# Patient Record
Sex: Female | Born: 1940 | Race: White | Hispanic: No | Marital: Married | Smoking: Never smoker
Health system: Southern US, Community
[De-identification: ages and names within clinical notes are randomized; demographics above are authoritative.]

## PROBLEM LIST (undated history)

## (undated) DIAGNOSIS — E785 Hyperlipidemia, unspecified: Secondary | ICD-10-CM

## (undated) DIAGNOSIS — I10 Essential (primary) hypertension: Secondary | ICD-10-CM

## (undated) DIAGNOSIS — M810 Age-related osteoporosis without current pathological fracture: Secondary | ICD-10-CM

## (undated) HISTORY — DX: Age-related osteoporosis without current pathological fracture: M81.0

## (undated) HISTORY — DX: Hyperlipidemia, unspecified: E78.5

## (undated) HISTORY — DX: Essential (primary) hypertension: I10

---

## 1996-04-15 HISTORY — PX: LUMBAR DISC SURGERY: SHX700

## 1998-06-13 LAB — HM COLONOSCOPY: HM Colonoscopy: NORMAL

## 1999-05-08 ENCOUNTER — Other Ambulatory Visit: Admission: RE | Admit: 1999-05-08 | Discharge: 1999-05-08 | Payer: Self-pay | Admitting: *Deleted

## 2003-06-27 ENCOUNTER — Encounter: Admission: RE | Admit: 2003-06-27 | Discharge: 2003-06-27 | Payer: Self-pay | Admitting: Internal Medicine

## 2004-10-24 ENCOUNTER — Encounter: Admission: RE | Admit: 2004-10-24 | Discharge: 2004-10-24 | Payer: Self-pay | Admitting: Internal Medicine

## 2005-11-15 ENCOUNTER — Encounter: Admission: RE | Admit: 2005-11-15 | Discharge: 2005-11-15 | Payer: Self-pay | Admitting: Internal Medicine

## 2006-05-08 ENCOUNTER — Other Ambulatory Visit: Admission: RE | Admit: 2006-05-08 | Discharge: 2006-05-08 | Payer: Self-pay | Admitting: Internal Medicine

## 2006-12-05 ENCOUNTER — Ambulatory Visit: Payer: Self-pay | Admitting: Vascular Surgery

## 2006-12-18 ENCOUNTER — Encounter: Admission: RE | Admit: 2006-12-18 | Discharge: 2006-12-18 | Payer: Self-pay | Admitting: Internal Medicine

## 2007-12-11 ENCOUNTER — Ambulatory Visit: Payer: Self-pay | Admitting: Vascular Surgery

## 2007-12-25 ENCOUNTER — Encounter: Admission: RE | Admit: 2007-12-25 | Discharge: 2007-12-25 | Payer: Self-pay | Admitting: Internal Medicine

## 2008-03-09 ENCOUNTER — Encounter: Payer: Self-pay | Admitting: Family Medicine

## 2008-07-18 DIAGNOSIS — I10 Essential (primary) hypertension: Secondary | ICD-10-CM

## 2008-07-18 DIAGNOSIS — E785 Hyperlipidemia, unspecified: Secondary | ICD-10-CM

## 2008-07-18 HISTORY — DX: Hyperlipidemia, unspecified: E78.5

## 2008-07-18 HISTORY — DX: Essential (primary) hypertension: I10

## 2008-07-25 ENCOUNTER — Ambulatory Visit: Payer: Self-pay | Admitting: Family Medicine

## 2008-07-25 DIAGNOSIS — M810 Age-related osteoporosis without current pathological fracture: Secondary | ICD-10-CM

## 2008-07-25 HISTORY — DX: Age-related osteoporosis without current pathological fracture: M81.0

## 2008-08-19 ENCOUNTER — Ambulatory Visit: Payer: Self-pay | Admitting: Family Medicine

## 2008-11-17 ENCOUNTER — Ambulatory Visit: Payer: Self-pay | Admitting: Family Medicine

## 2008-11-17 LAB — CONVERTED CEMR LAB
ALT: 20 units/L (ref 0–35)
Direct LDL: 124.6 mg/dL
HDL: 96.2 mg/dL (ref >39.00)
Total Bilirubin: 1.1 mg/dL (ref 0.3–1.2)
Total CHOL/HDL Ratio: 2
Triglycerides: 53 mg/dL (ref 0.0–149.0)
VLDL: 10.6 mg/dL (ref 0.0–40.0)

## 2008-12-08 ENCOUNTER — Telehealth: Payer: Self-pay | Admitting: Family Medicine

## 2008-12-26 ENCOUNTER — Encounter: Admission: RE | Admit: 2008-12-26 | Discharge: 2008-12-26 | Payer: Self-pay | Admitting: Family Medicine

## 2009-01-23 ENCOUNTER — Ambulatory Visit: Payer: Self-pay | Admitting: Family Medicine

## 2009-05-19 ENCOUNTER — Ambulatory Visit: Payer: Self-pay | Admitting: Family Medicine

## 2009-05-22 LAB — CONVERTED CEMR LAB
Albumin: 4.4 g/dL (ref 3.5–5.2)
Alkaline Phosphatase: 59 units/L (ref 39–117)
Cholesterol: 226 mg/dL — ABNORMAL HIGH (ref 0–200)
Total Protein: 7 g/dL (ref 6.0–8.3)
Triglycerides: 62 mg/dL (ref 0.0–149.0)
VLDL: 12.4 mg/dL (ref 0.0–40.0)

## 2009-05-26 ENCOUNTER — Ambulatory Visit: Payer: Self-pay | Admitting: Family Medicine

## 2009-05-26 LAB — CONVERTED CEMR LAB: LDL Goal: 100 mg/dL

## 2009-11-23 ENCOUNTER — Ambulatory Visit: Payer: Self-pay | Admitting: Family Medicine

## 2010-01-19 ENCOUNTER — Encounter: Admission: RE | Admit: 2010-01-19 | Discharge: 2010-01-19 | Payer: Self-pay | Admitting: Family Medicine

## 2010-05-15 NOTE — Assessment & Plan Note (Signed)
Summary: [name] [name]   Vital Signs:  Patient profile:   70 year old female Menstrual status:  postmenopausal Height:      64 inches Weight:      120 pounds BMI:     20.67 Temp:     97.6 degrees F oral BP sitting:   140 / 84  (left arm) Cuff size:   regular  Vitals Entered By: Sid Falcon LPN (May 26, 2009 10:42 AM) CC: ROV follow-up, labs done, Hypertension Management, Lipid Management Is Patient Diabetic? No   History of Present Illness: Patient seen for followup regarding hypertension and hyperlipidemia. Blood pressure log reviewed and blood pressure is very well controlled on Benicar 20 mg. Taking Crestor consistently. Labs reviewed with patient. Excellent HDL of 114. LDL close to goal. Previous carotid artery disease. No recent TIA symptoms. No chest pain. Needs tetanus booster.  History of osteoporosis. Patient took herself off Fosamax recently. Exercising and taking adequate calcium and vitamin D. No recent falls.  Hypertension History:      She denies headache, chest pain, palpitations, dyspnea with exertion, orthopnea, PND, peripheral edema, visual symptoms, neurologic problems, syncope, and side effects from treatment.  She notes no problems with any antihypertensive medication side effects.        Positive major cardiovascular risk factors include female age 44 years old or older, hyperlipidemia, and hypertension.  Negative major cardiovascular risk factors include no history of diabetes, negative family history for ischemic heart disease, and non-tobacco-user status.        Positive history for target organ damage include peripheral vascular disease.  Further assessment for target organ damage reveals no history of ASHD or stroke/TIA.    Lipid Management History:      Positive NCEP/ATP III risk factors include female age 64 years old or older, hypertension, and peripheral vascular disease.  Negative NCEP/ATP III risk factors include non-diabetic, HDL cholesterol greater  than 60, no family history for ischemic heart disease, non-tobacco-user status, no ASHD (atherosclerotic heart disease), no prior stroke/TIA, and no history of aortic aneurysm.      Allergies: No Known Drug Allergies  Past History:  Past Medical History: Last updated: 01/23/2009 frequent headaches, early Hyperlipidemia Hypertension Osteoporosis  Social History: Last updated: 07/25/2008 Retired Married Alcohol use-yes socially Never Smoked PMH reviewed for relevance, PSH reviewed for relevance  Review of Systems  The patient denies anorexia, weight loss, weight gain, chest pain, syncope, dyspnea on exertion, peripheral edema, prolonged cough, headaches, hemoptysis, abdominal pain, melena, and hematochezia.    Physical Exam  General:  Well-developed,well-nourished,in no acute distress; alert,appropriate and cooperative throughout examination Ears:  External ear exam shows no significant lesions or deformities.  Otoscopic examination reveals clear canals, tympanic membranes are intact bilaterally without bulging, retraction, inflammation or discharge. Hearing is grossly normal bilaterally. Mouth:  Oral mucosa and oropharynx without lesions or exudates.  Teeth in good repair. Neck:  No deformities, masses, or tenderness noted. Lungs:  Normal respiratory effort, chest expands symmetrically. Lungs are clear to auscultation, no crackles or wheezes. Heart:  normal rate and regular rhythm.   Pulses:  R radial normal and L radial normal.   Extremities:  no edema Neurologic:  alert & oriented X3, cranial nerves II-XII intact, and strength normal in all extremities.   Psych:  normally interactive, good eye contact, not anxious appearing, and not depressed appearing.     Impression & Recommendations:  Problem # 1:  HYPERTENSION (ICD-401.9) stable. Her updated medication list for this problem includes:  Benicar 40 Mg Tabs (Olmesartan medoxomil) .Marland Kitchen... 1/2 tab daily  Problem # 2:   HYPERLIPIDEMIA (ICD-272.4) LDL 107 but HDL 114.  Cont same dose Crestor. Her updated medication list for this problem includes:    Crestor 20 Mg Tabs (Rosuvastatin calcium) ..... Once daily  Problem # 3:  Preventive Health Care (ICD-V70.0) tetanus booster be given. Patient has not had DEXA scan in 2 years at this point does not wish to pursue further scans.  Problem # 4:  OSTEOPOROSIS (ICD-733.00)  Her updated medication list for this problem includes:    Calcium 600 1500 Mg Tabs (Calcium carbonate) ..... One tab two times a day    Vitamin D 1000 Unit Tabs (Cholecalciferol) ..... Once daily  Complete Medication List: 1)  Benicar 40 Mg Tabs (Olmesartan medoxomil) .... 1/2 tab daily 2)  Crestor 20 Mg Tabs (Rosuvastatin calcium) .... Once daily 3)  Multi For Her 50+ Tabs (Multiple vitamins-minerals) .... Once daily 4)  Aspirin Adult Low Strength 81 Mg Tbec (Aspirin) .... Once daily 5)  Calcium 600 1500 Mg Tabs (Calcium carbonate) .... One tab two times a day 6)  Vitamin D 1000 Unit Tabs (Cholecalciferol) .... Once daily  Other Orders: TD Toxoids IM 7 YR + (16109) Admin 1st Vaccine (60454)  Hypertension Assessment/Plan:      The patient's hypertensive risk group is category C: Target organ damage and/or diabetes.  Today's blood pressure is 140/84.    Lipid Assessment/Plan:      Based on NCEP/ATP III, the patient's risk factor category is "history of coronary disease, peripheral vascular disease, cerebrovascular disease, or aortic aneurysm".  The patient's lipid goals are as follows: Total cholesterol goal is 200; LDL cholesterol goal is 100; HDL cholesterol goal is 40; Triglyceride goal is 150.    Patient Instructions: 1)  Please schedule a follow-up appointment in 6 months .  2)  It is important that you exercise reguarly at least 20 minutes 5 times a week. If you develop chest pain, have severe difficulty breathing, or feel very tired, stop exercising immediately and seek medical  attention.  3)  Take calcium +vitamin D daily.   Preventive Care Screening  Mammogram:    Date:  12/14/2008    Results:  normal   Pap Smear:    Date:  04/16/2003    Results:  normal    Preventive Care Screening  Mammogram:    Date:  12/14/2008    Results:  normal   Pap Smear:    Date:  04/16/2003    Results:  normal     Immunizations Administered:  Tetanus Vaccine:    Vaccine Type: Td    Site: left deltoid    Mfr: Sanofi Pasteur    Dose: 0.5 ml    Route: IM    Given by: Sid Falcon LPN    Exp. Date: 02/28/2011    Lot #: [id number]

## 2010-05-15 NOTE — Assessment & Plan Note (Signed)
Summary: 6 month follow up/pt coming in fasting/cjr   Vital Signs:  Patient profile:   70 year old female Menstrual status:  postmenopausal Weight:      117 pounds Temp:     98.2 degrees F oral Pulse rate:   66 / minute BP sitting:   160 / 90  (left arm) Cuff size:   regular  Vitals Entered By: Romualdo Bolk, CMA (AAMA) (November 23, 2009 8:26 AM)  Serial Vital Signs/Assessments:  Time      Position  BP       Pulse  Resp  Temp     By                     142/82                         Evelena Peat MD  CC: Follow-up visit- Pt is fasting for labs, Hypertension Management   History of Present Illness: Patient here for followup hypertension and hyperlipidemia. Blood pressure is well controlled by home readings. Remains on Benicar 40 mg one half tablet daily.  Lipids adequately controlled last visit. No side effects from medication. No history of CAD or cerebrovascular disease. Mild peripheral vascular disease carotid arteries from prior [facility] screening. No hx of TIA .  History osteoporosis. No history of fall or fracture. Patient taking regular calcium and vitamin D and quite active. Reluctant to consider further osteoporosis medications.  Hypertension History:      She denies headache, chest pain, palpitations, dyspnea with exertion, orthopnea, PND, peripheral edema, visual symptoms, neurologic problems, syncope, and side effects from treatment.  She notes no problems with any antihypertensive medication side effects.        Positive major cardiovascular risk factors include female age 70 years old or older, hyperlipidemia, and hypertension.  Negative major cardiovascular risk factors include no history of diabetes, negative family history for ischemic heart disease, and non-tobacco-user status.        Positive history for target organ damage include peripheral vascular disease.  Further assessment for target organ damage reveals no history of ASHD or stroke/TIA.      Preventive Screening-Counseling & Management  Alcohol-Tobacco     Alcohol drinks/day: 1     Alcohol type: wine     Smoking Status: never  Caffeine-Diet-Exercise     Caffeine use/day: 1     Does Patient Exercise: yes  Current Medications (verified): 1)  Benicar 40 Mg Tabs (Olmesartan Medoxomil) .... 1/2 Tab Daily 2)  Crestor 20 Mg Tabs (Rosuvastatin Calcium) .... Once Daily 3)  Multi For Her 50+  Tabs (Multiple Vitamins-Minerals) .... Once Daily 4)  Aspirin Adult Low Strength 81 Mg Tbec (Aspirin) .... Once Daily 5)  Calcium 600 1500 Mg Tabs (Calcium Carbonate) .... One Tab Two Times A Day 6)  Vitamin D 1000 Unit Tabs (Cholecalciferol) .... Once Daily  Allergies (verified): No Known Drug Allergies  Past History:  Past Medical History: Last updated: 01/23/2009 frequent headaches, early Hyperlipidemia Hypertension Osteoporosis  Past Surgical History: Last updated: 07/18/2008 Discectomy 1998  Family History: Last updated: 07/25/2008 Family History High cholesterol Family History Hypertension Ovarian CA, mother Breast CA, aunt Cerebral aneurysm father  Social History: Last updated: 07/25/2008 Retired Married Alcohol use-yes socially Never Smoked  Risk Factors: Alcohol Use: 1 (11/23/2009) Caffeine Use: 1 (11/23/2009) Exercise: yes (11/23/2009)  Risk Factors: Smoking Status: never (11/23/2009) PMH-FH-SH reviewed for relevance  Social History: Caffeine use/day:  1  Does Patient Exercise:  yes  Review of Systems  The patient denies anorexia, fever, weight loss, chest pain, syncope, dyspnea on exertion, peripheral edema, prolonged cough, headaches, hemoptysis, abdominal pain, melena, hematochezia, severe indigestion/heartburn, muscle weakness, and difficulty walking.    Physical Exam  General:  Well-developed,well-nourished,in no acute distress; alert,appropriate and cooperative throughout examination Mouth:  Oral mucosa and oropharynx without lesions  or exudates.  Teeth in good repair. Neck:  No deformities, masses, or tenderness noted. Lungs:  Normal respiratory effort, chest expands symmetrically. Lungs are clear to auscultation, no crackles or wheezes. Heart:  normal rate, regular rhythm, and no murmur.   Extremities:  No clubbing, cyanosis, edema, or deformity noted with normal full range of motion of all joints.   Skin:  no rashes and no suspicious lesions.   Psych:  normally interactive, good eye contact, not anxious appearing, and not depressed appearing.     Impression & Recommendations:  Problem # 1:  HYPERTENSION (ICD-401.9) suspect whitecoat syndrome Her updated medication list for this problem includes:    Benicar 40 Mg Tabs (Olmesartan medoxomil) .Marland Kitchen... 1/2 tab daily  Problem # 2:  HYPERLIPIDEMIA (ICD-272.4)  Her updated medication list for this problem includes:    Crestor 20 Mg Tabs (Rosuvastatin calcium) ..... Once daily  Problem # 3:  OSTEOPOROSIS (ICD-733.00) have again discussed possible treatment options and she dose not wish to consider further meds.  She is aware foundation of treatment/prevention is adequate calcium/Vit D and exercise. Her updated medication list for this problem includes:    Calcium 600 1500 Mg Tabs (Calcium carbonate) ..... One tab two times a day    Vitamin D 1000 Unit Tabs (Cholecalciferol) ..... Once daily  Complete Medication List: 1)  Benicar 40 Mg Tabs (Olmesartan medoxomil) .... 1/2 tab daily 2)  Crestor 20 Mg Tabs (Rosuvastatin calcium) .... Once daily 3)  Multi For Her 50+ Tabs (Multiple vitamins-minerals) .... Once daily 4)  Aspirin Adult Low Strength 81 Mg Tbec (Aspirin) .... Once daily 5)  Calcium 600 1500 Mg Tabs (Calcium carbonate) .... One tab two times a day 6)  Vitamin D 1000 Unit Tabs (Cholecalciferol) .... Once daily  Hypertension Assessment/Plan:      The patient's hypertensive risk group is category C: Target organ damage and/or diabetes.  Today's blood pressure is  160/90.    Patient Instructions: 1)  Please schedule a follow-up appointment in 6 months .  2)  It is important that you exercise reguarly at least 20 minutes 5 times a week. If you develop chest pain, have severe difficulty breathing, or feel very tired, stop exercising immediately and seek medical attention.  3)  Take calcium +vitamin D daily.

## 2010-06-13 ENCOUNTER — Encounter: Payer: Self-pay | Admitting: Family Medicine

## 2010-06-13 ENCOUNTER — Ambulatory Visit (INDEPENDENT_AMBULATORY_CARE_PROVIDER_SITE_OTHER): Payer: PRIVATE HEALTH INSURANCE | Admitting: Family Medicine

## 2010-06-13 DIAGNOSIS — E785 Hyperlipidemia, unspecified: Secondary | ICD-10-CM

## 2010-06-13 DIAGNOSIS — M810 Age-related osteoporosis without current pathological fracture: Secondary | ICD-10-CM

## 2010-06-13 DIAGNOSIS — I10 Essential (primary) hypertension: Secondary | ICD-10-CM

## 2010-06-13 LAB — HEPATIC FUNCTION PANEL
Albumin: 4.4 g/dL (ref 3.5–5.2)
Bilirubin, Direct: 0.1 mg/dL (ref 0.0–0.3)
Total Protein: 6.7 g/dL (ref 6.0–8.3)

## 2010-06-13 LAB — LIPID PANEL
Cholesterol: 233 mg/dL — ABNORMAL HIGH (ref 0–200)
HDL: 115.2 mg/dL (ref >39.00)
VLDL: 12 mg/dL (ref 0.0–40.0)

## 2010-06-13 LAB — BASIC METABOLIC PANEL
Chloride: 102 mEq/L (ref 96–112)
Potassium: 4.6 mEq/L (ref 3.5–5.1)

## 2010-06-13 LAB — LDL CHOLESTEROL, DIRECT: Direct LDL: 109.2 mg/dL

## 2010-06-13 NOTE — Progress Notes (Signed)
  Subjective:    Patient ID: [name] [name], female    DOB: 31-Aug-1940, 70 y.o.   MRN: [id number]  HPI  the patient is seen for routine medical followup. Chronic positive history of osteoporosis, hyperlipidemia, and hypertension. She remains on Benicar 20 mg daily. While the blood pressure readings at home very well controlled. No orthostasis. The chest pains or dizziness.   Hyperlipidemia treated with Crestor. No myalgias or arthralgias. No history of CAD or peripheral vascular disease.  History of osteoporosis. treated for several years with Fosamax. She is reluctant to go for the medications at this time.  She remains on calcium and vitamin D and walking for weight-bearing exercise.   Review of Systems  Constitutional: Negative for fever, activity change, appetite change and fatigue.  HENT: Negative for hearing loss, ear pain, sore throat and trouble swallowing.   Eyes: Negative for visual disturbance.  Respiratory: Negative for cough and shortness of breath.   Cardiovascular: Negative for chest pain and palpitations.  Gastrointestinal: Negative for abdominal pain, diarrhea, constipation and blood in stool.  Genitourinary: Negative for dysuria and hematuria.  Musculoskeletal: Negative for myalgias, back pain and arthralgias.  Skin: Negative for rash.  Neurological: Negative for dizziness, syncope and headaches.  Hematological: Negative for adenopathy.  Psychiatric/Behavioral: Negative for confusion and dysphoric mood.       Objective:   Physical Exam  patient is alert and in no distress Oropharynx is clear Pupils equal reactive to light Eardrums are normal Neck supple no mass Chest clear to auscultation Heart regular rhythm and rate Extremities no edema       Assessment & Plan:   #1 hypertension well controlled. Provided further samples of Benicar. Routine followup 6 months #2   Hyperlipidemia. Check lipid panel and hepatic panel.  #3 osteoporosis. Discussed treatment  options. Reluctant to look at further prescription medications. Continue weightbearing exercise, calcium, and vitamin D.  #4 health maintenance. Discussed colonoscopy screening. She refuses at this time. Discuss Hemoccults and she does not wish to pursue. Will continue with yearly mammogram.

## 2010-06-14 NOTE — Progress Notes (Signed)
Quick Note:  Pt husband informed, home number, only number ______

## 2010-08-28 NOTE — Consult Note (Signed)
NEW PATIENT CONSULTATION   [name], [name]  DOB:  1940-09-18                                       12/05/2006  CHART#:[id number]   HISTORY OF PRESENT ILLNESS:  The patient presents today for evaluation  of asymptomatic carotid disease.  She is an otherwise, healthy, very  pleasant, 70 year old white female who was noted to have carotid disease  on an outpatient screen and I am seeing her for further discussion of  this.  She has no prior focal neurologic deficits, specifically no  amaurosis fugax, CVA, TIA, MI or stroke.  She does have slightly  elevated lipids but otherwise no major medical difficulties.  She does  have mild hypertension and osteoporosis.   MEDICATIONS:  Hydrochlorothiazide, multivitamins.  She does an 81 mg  aspirin per day.  She is on Fosamax and Crestor and Benicar.   ALLERGIES:  No known allergies.   SOCIAL HISTORY:  She does not smoke cigarettes and has rare alcohol  abuse.   FAMILY HISTORY:  Significant for father dying at age 70 with cerebral  aneurysm.  She has no history of premature atherosclerotic disease in  her family.   PHYSICAL EXAMINATION:  General:  Well developed and well nourished white  female appearing her stated age of 23.  Vital signs:  Blood pressure  145/76 left arm, 145/78 right arm, pulse 81, respirations 16.  Her  carotids upstrokes are without bruit bilaterally.  She has 2+ radial  pulses and 2+ posterior tibial pulses bilaterally.   I reviewed her carotid duplex from [facility] on  09/15/06.  This revealed 40-59% right internal carotid artery stenosis  which I would say is at the lower end of this scale and mild stenosis in  the left carotid system.  I discussed this at length with the patient.  I explained that this does not put her at any significant increased risk  for stroke with mild to moderate disease.  I reviewed symptoms of  carotid disease with her; she will notify me should this  occur.  Otherwise, we will see her again in one year with repeat carotid duplex  at that time for follow up.   Larina Earthly, M.D.  Electronically Signed   TFE/MEDQ  D:  12/05/2006  T:  12/08/2006  Job:  [id number]   cc:   Soyla Murphy. Renne Crigler, M.D.

## 2010-08-28 NOTE — Procedures (Signed)
CAROTID DUPLEX EXAM   INDICATION:  Followup evaluation of known carotid artery disease.   HISTORY:  Diabetes:  No.  Cardiac:  No.  Hypertension:  Yes.  Smoking:  No.  Previous Surgery:  No.  CV History:  Previous duplex performed 09/15/2006 revealed 40-59% right  ICA stenosis and less than 40% left ICA stenosis.  Amaurosis Fugax No, Paresthesias No, Hemiparesis No                                       RIGHT             LEFT  Brachial systolic pressure:         126               126  Brachial Doppler waveforms:         Triphasic         Triphasic  Vertebral direction of flow:        Antegrade         Antegrade  DUPLEX VELOCITIES (cm/sec)  CCA peak systolic                   91                82  ECA peak systolic                   127               128  ICA peak systolic                   133               137  ICA end diastolic                   38                44  PLAQUE MORPHOLOGY:                  Calcified         Calcified  PLAQUE AMOUNT:                      Mild to moderate  Mild to moderate  PLAQUE LOCATION:                    Proximal ICA      Proximal ICA       IMPRESSION:  1. 40-59% ICA stenosis bilaterally.  2. No significant change from previous study performed 09/15/2006.   ___________________________________________  Larina Earthly, M.D.   MC/MEDQ  D:  12/11/2007  T:  12/11/2007  Job:  [id number]

## 2010-08-28 NOTE — Assessment & Plan Note (Signed)
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  She is a healthy 70 year old white  female with known asymptomatic carotid disease.  She denies any  neurologic deficits, specifically amaurosis fugax, transient ischemic  attack, or stroke.  Her past medical history continues to be for  controlled hypertension and elevated cholesterol.   SOCIAL HISTORY:  She is married.  Does not smoke.  Has 1 alcohol drink  per day.   REVIEW OF SYSTEMS:  Reveals weight 120 pounds, height 5 feet 5 inches  tall.   MEDICATIONS:  Fosamax, Benicar, Crestor, multivitamin, and calcium  supplement.   PHYSICAL EXAM:  Well-developed, well-nourished white female appearing  stated age of 50.  Blood pressure is 118/70, pulse 74, respirations 18.  Her carotid arteries without bruits bilaterally.  She has 2+ radial  pulses bilaterally.  She is grossly intact neurologically.   She underwent repeat carotid duplex today in our office and I reviewed  this with her.  This reveals no change in her carotid study.  She does  have a moderate 40-59% internal carotid artery stenosis bilaterally.  I  discussed this at length with the patient.  Recommended continued yearly  observation and ultrasound.  She will notify us should she develop any  new deficits.   Larina Earthly, M.D.  Electronically Signed   TFE/MEDQ  D:  12/11/2007  T:  12/14/2007  Job:  [id number]   cc:   Soyla Murphy. Renne Crigler, M.D.

## 2010-10-23 ENCOUNTER — Other Ambulatory Visit: Payer: Self-pay | Admitting: Family Medicine

## 2010-12-12 ENCOUNTER — Ambulatory Visit (INDEPENDENT_AMBULATORY_CARE_PROVIDER_SITE_OTHER): Payer: PRIVATE HEALTH INSURANCE | Admitting: Family Medicine

## 2010-12-12 ENCOUNTER — Encounter: Payer: Self-pay | Admitting: Family Medicine

## 2010-12-12 DIAGNOSIS — E785 Hyperlipidemia, unspecified: Secondary | ICD-10-CM

## 2010-12-12 DIAGNOSIS — M810 Age-related osteoporosis without current pathological fracture: Secondary | ICD-10-CM

## 2010-12-12 DIAGNOSIS — I10 Essential (primary) hypertension: Secondary | ICD-10-CM

## 2010-12-12 NOTE — Progress Notes (Signed)
  Subjective:    Patient ID: [name] [name], female    DOB: [birth year], 70 y.o.   MRN: [id number]  HPI Medical followup. History of hyperlipidemia, hypertension, and osteoporosis. She takes Crestor 5 days per week. History of excellent HDL. Lipids were checked in February and stable. Takes Benicar 40 mg one half tablet daily. Blood pressure well controlled by home readings which are reviewed. No orthostasis. Denies any chest pains or dyspnea. Walking for exercise most days of week.  Past Medical History  Diagnosis Date  . HYPERLIPIDEMIA 07/18/2008  . HYPERTENSION 07/18/2008  . OSTEOPOROSIS 07/25/2008   Past Surgical History  Procedure Date  . Lumbar disc surgery 1998    reports that she has never smoked. She does not have any smokeless tobacco history on file. Her alcohol and drug histories not on file. family history includes Aneurysm in her father; Cancer in her paternal aunt and paternal grandmother; Hyperlipidemia in her mother and sister; Hypertension in her mother and sister; and Stroke (age of onset:47) in her father. No Known Allergies    Review of Systems  Constitutional: Negative for fatigue.  Eyes: Negative for visual disturbance.  Respiratory: Negative for cough, chest tightness, shortness of breath and wheezing.   Cardiovascular: Negative for chest pain, palpitations and leg swelling.  Neurological: Negative for dizziness, seizures, syncope, weakness, light-headedness and headaches.       Objective:   Physical Exam  Constitutional: She is oriented to person, place, and time. She appears well-developed and well-nourished.  HENT:  Head: Normocephalic and atraumatic.  Eyes: EOM are normal. Pupils are equal, round, and reactive to light.  Neck: Normal range of motion. Neck supple. No thyromegaly present.  Cardiovascular: Normal rate, regular rhythm and normal heart sounds.   No murmur heard. Pulmonary/Chest: Breath sounds normal. No respiratory distress. She has no wheezes. She  has no rales.  Abdominal: Soft. Bowel sounds are normal.  Musculoskeletal: Normal range of motion. She exhibits no edema.  Lymphadenopathy:    She has no cervical adenopathy.  Neurological: She is alert and oriented to person, place, and time. No cranial nerve deficit.  Skin: No rash noted.  Psychiatric: She has a normal mood and affect. Her behavior is normal. Judgment and thought content normal.          Assessment & Plan:  #1 hypertension stable. Continue current medications. Samples of Benicar provided #2 hyperlipidemia. Recheck lipids in 6 months. No intolerance to Crestor #3 osteoporosis. Reviewed exercise, vitamin D, and calcium.

## 2011-01-17 ENCOUNTER — Other Ambulatory Visit: Payer: Self-pay | Admitting: Family Medicine

## 2011-01-23 ENCOUNTER — Other Ambulatory Visit: Payer: Self-pay | Admitting: Family Medicine

## 2011-01-23 DIAGNOSIS — Z1231 Encounter for screening mammogram for malignant neoplasm of breast: Secondary | ICD-10-CM

## 2011-01-29 ENCOUNTER — Ambulatory Visit
Admission: RE | Admit: 2011-01-29 | Discharge: 2011-01-29 | Disposition: A | Discharge status: Home / Self Care | Payer: Medicare Other | Source: Ambulatory Visit | Attending: Family Medicine | Admitting: Family Medicine

## 2011-01-29 DIAGNOSIS — Z1231 Encounter for screening mammogram for malignant neoplasm of breast: Secondary | ICD-10-CM

## 2011-05-23 ENCOUNTER — Telehealth: Payer: Self-pay | Admitting: Family Medicine

## 2011-05-23 NOTE — Telephone Encounter (Signed)
Pt has ov sch on 06/13/11, but is needing samples of olmesartan (BENICAR) 40 MG tablet to last until her ov. Pt says that she is 2 and 1/2 wks short on having enough. Pls call when ready for pick up.

## 2011-05-23 NOTE — Telephone Encounter (Signed)
Pt informed samples ready to pick-up 

## 2011-06-13 ENCOUNTER — Ambulatory Visit (INDEPENDENT_AMBULATORY_CARE_PROVIDER_SITE_OTHER): Payer: Medicare Other | Admitting: Family Medicine

## 2011-06-13 ENCOUNTER — Encounter: Payer: Self-pay | Admitting: Family Medicine

## 2011-06-13 DIAGNOSIS — I10 Essential (primary) hypertension: Secondary | ICD-10-CM

## 2011-06-13 DIAGNOSIS — M810 Age-related osteoporosis without current pathological fracture: Secondary | ICD-10-CM

## 2011-06-13 DIAGNOSIS — E785 Hyperlipidemia, unspecified: Secondary | ICD-10-CM

## 2011-06-13 LAB — LIPID PANEL
Cholesterol: 224 mg/dL — ABNORMAL HIGH (ref 0–200)
HDL: 116.1 mg/dL (ref >39.00)
Triglycerides: 53 mg/dL (ref 0.0–149.0)
VLDL: 10.6 mg/dL (ref 0.0–40.0)

## 2011-06-13 LAB — HEPATIC FUNCTION PANEL
ALT: 20 U/L (ref 0–35)
Total Bilirubin: 0.7 mg/dL (ref 0.3–1.2)

## 2011-06-13 LAB — BASIC METABOLIC PANEL
BUN: 14 mg/dL (ref 6–23)
Creatinine, Ser: 1 mg/dL (ref 0.4–1.2)
GFR: 57.81 mL/min — ABNORMAL LOW (ref >60.00)

## 2011-06-13 LAB — LDL CHOLESTEROL, DIRECT: Direct LDL: 108.7 mg/dL

## 2011-06-13 NOTE — Patient Instructions (Signed)
Continue to monitor blood pressure closely. 

## 2011-06-13 NOTE — Progress Notes (Signed)
  Subjective:    Patient ID: [name] [name], female    DOB: 29-Dec-1940, 71 y.o.   MRN: [id number]  HPI  patient seen for medical followup. Has history of hypertension, hyperlipidemia, and osteoporosis. She is opposed to taking any prescription osteoporosis medications. She does take calcium and vitamin D. She exercises several days per week.  Blood pressures are well controlled by home readings. Average readings over the past several weeks 118 systolic and 62 diastolic. Takes Benicar 40 mg daily.  Compliant with therapy. No side effects. No headaches. Hyperlipidemia treated Crestor 20 mg 5 days per week. No myalgias. Compliant with therapy. History of excellent HDL but also high LDL. No history of CAD or peripheral vascular disease. Nonsmoker.  Past Medical History  Diagnosis Date  . HYPERLIPIDEMIA 07/18/2008  . HYPERTENSION 07/18/2008  . OSTEOPOROSIS 07/25/2008   Past Surgical History  Procedure Date  . Lumbar disc surgery 1998    reports that she has never smoked. She does not have any smokeless tobacco history on file. Her alcohol and drug histories not on file. family history includes Aneurysm in her father; Cancer in her paternal aunt and paternal grandmother; Hyperlipidemia in her mother and sister; Hypertension in her mother and sister; and Stroke (age of onset:47) in her father. No Known Allergies    Review of Systems  Constitutional: Negative for fatigue.  Eyes: Negative for visual disturbance.  Respiratory: Negative for cough, chest tightness, shortness of breath and wheezing.   Cardiovascular: Negative for chest pain, palpitations and leg swelling.  Neurological: Negative for dizziness, seizures, syncope, weakness, light-headedness and headaches.  Psychiatric/Behavioral: Negative for dysphoric mood.       Objective:   Physical Exam  Constitutional: She is oriented to person, place, and time. She appears well-developed and well-nourished.  HENT:  Head: Normocephalic and  atraumatic.  Eyes: EOM are normal. Pupils are equal, round, and reactive to light.  Neck: Normal range of motion. Neck supple. No thyromegaly present.  Cardiovascular: Normal rate, regular rhythm and normal heart sounds.   No murmur heard. Pulmonary/Chest: Breath sounds normal. No respiratory distress. She has no wheezes. She has no rales.  Abdominal: Soft. Bowel sounds are normal. She exhibits no distension and no mass. There is no tenderness. There is no rebound and no guarding.  Musculoskeletal: Normal range of motion. She exhibits no edema.  Lymphadenopathy:    She has no cervical adenopathy.  Neurological: She is alert and oriented to person, place, and time. She displays normal reflexes. No cranial nerve deficit.  Skin: No rash noted.  Psychiatric: She has a normal mood and affect. Her behavior is normal. Judgment and thought content normal.          Assessment & Plan:  #1 hypertension. Elevated reading here today but well controlled by home readings. Continue close monitoring. Be in touch if consistently over 140/90  #2 hyperlipidemia check lipid and hepatic panel  #3 history osteoporosis. Discussed medications again today. She is opposed to prescription medications. Continue calcium and vitamin D. Check vitamin D level.

## 2011-06-14 LAB — VITAMIN D 25 HYDROXY (VIT D DEFICIENCY, FRACTURES): Vit D, 25-Hydroxy: 50 ng/mL (ref 30–89)

## 2011-06-14 NOTE — Progress Notes (Signed)
Quick Note:  Pt informed, copy mailed to home as requested ______ 

## 2011-06-17 NOTE — Progress Notes (Signed)
Quick Note:  Pt informed, copy mailed as requested ______

## 2011-10-02 ENCOUNTER — Telehealth: Payer: Self-pay | Admitting: Family Medicine

## 2011-10-02 NOTE — Telephone Encounter (Signed)
Pt informed samples ready to pick-up

## 2011-10-02 NOTE — Telephone Encounter (Signed)
Pt requesting more samples of Benicar. Pt states that she only has 2 weeks of samples left. Please contact

## 2011-12-11 ENCOUNTER — Encounter: Payer: Self-pay | Admitting: Family Medicine

## 2011-12-11 ENCOUNTER — Ambulatory Visit (INDEPENDENT_AMBULATORY_CARE_PROVIDER_SITE_OTHER): Payer: Medicare Other | Admitting: Family Medicine

## 2011-12-11 VITALS — BP 162/78 | Temp 98.1°F | Wt 114.0 lb

## 2011-12-11 DIAGNOSIS — I10 Essential (primary) hypertension: Secondary | ICD-10-CM

## 2011-12-11 NOTE — Patient Instructions (Addendum)
Consider home blood pressure monitor and be in touch if BP consistently > 140/90

## 2011-12-11 NOTE — Progress Notes (Signed)
  Subjective:    Patient ID: [name] [name], female    DOB: 1940-12-19, 71 y.o.   MRN: [id number]  HPI  Medical follow up. History of hypertension which has been well controlled by home readings. Review of home readings reveals blood pressures mostly 130s systolic. No headaches or dizziness. Walks for exercise. Hyperlipidemia treated with Crestor. She has history of osteoporosis and has refused prescription medications. She remains on calcium and vitamin D along with weight bearing exercise and frequent yoga. Overall feels well. No mood issues. Very stable and very low risk for falls. Receiving yearly mammograms   Review of Systems  Constitutional: Negative for fatigue and unexpected weight change.  Eyes: Negative for visual disturbance.  Respiratory: Negative for cough, chest tightness, shortness of breath and wheezing.   Cardiovascular: Negative for chest pain, palpitations and leg swelling.  Neurological: Negative for dizziness, seizures, syncope, weakness, light-headedness and headaches.       Objective:   Physical Exam  Constitutional: She appears well-developed and well-nourished.  Neck: Neck supple.  Cardiovascular: Normal rate and regular rhythm.   Pulmonary/Chest: Effort normal and breath sounds normal. No respiratory distress. She has no wheezes. She has no rales.  Musculoskeletal: She exhibits no edema.          Assessment & Plan:  #1 hypertension. Probable whitecoat syndrome. Stable by home readings. Patient encouraged to continue monitoring.  Be in touch if blood pressure consistently greater than 140/90 #2 hyperlipidemia. Continue Crestor. Check lipids at followup.. Overall low risk for CAD and peripheral vascular disease. She has history of excellent HDL

## 2012-01-29 ENCOUNTER — Other Ambulatory Visit: Payer: Self-pay | Admitting: Family Medicine

## 2012-02-20 ENCOUNTER — Other Ambulatory Visit: Payer: Self-pay | Admitting: Family Medicine

## 2012-02-20 DIAGNOSIS — Z1231 Encounter for screening mammogram for malignant neoplasm of breast: Secondary | ICD-10-CM

## 2012-04-03 ENCOUNTER — Ambulatory Visit
Admission: RE | Admit: 2012-04-03 | Discharge: 2012-04-03 | Disposition: A | Discharge status: Home / Self Care | Payer: Medicare Other | Source: Ambulatory Visit | Attending: Family Medicine | Admitting: Family Medicine

## 2012-04-03 DIAGNOSIS — Z1231 Encounter for screening mammogram for malignant neoplasm of breast: Secondary | ICD-10-CM

## 2012-05-11 ENCOUNTER — Other Ambulatory Visit: Payer: Self-pay | Admitting: Family Medicine

## 2012-05-11 NOTE — Telephone Encounter (Signed)
Pt informed 4 samples available

## 2012-05-11 NOTE — Telephone Encounter (Signed)
Pt would like more samples of benicar 40 mg. Pt has appt end of feb.

## 2012-06-12 ENCOUNTER — Ambulatory Visit (INDEPENDENT_AMBULATORY_CARE_PROVIDER_SITE_OTHER): Payer: Medicare Other | Admitting: Family Medicine

## 2012-06-12 ENCOUNTER — Encounter: Payer: Self-pay | Admitting: Family Medicine

## 2012-06-12 VITALS — BP 152/88 | Temp 97.7°F | Wt 119.0 lb

## 2012-06-12 DIAGNOSIS — M810 Age-related osteoporosis without current pathological fracture: Secondary | ICD-10-CM

## 2012-06-12 DIAGNOSIS — E785 Hyperlipidemia, unspecified: Secondary | ICD-10-CM

## 2012-06-12 DIAGNOSIS — I10 Essential (primary) hypertension: Secondary | ICD-10-CM

## 2012-06-12 LAB — LIPID PANEL
HDL: 116.7 mg/dL (ref >39.00)
Triglycerides: 55 mg/dL (ref 0.0–149.0)

## 2012-06-12 LAB — HEPATIC FUNCTION PANEL
ALT: 21 U/L (ref 0–35)
AST: 28 U/L (ref 0–37)
Bilirubin, Direct: 0.1 mg/dL (ref 0.0–0.3)
Total Protein: 7.1 g/dL (ref 6.0–8.3)

## 2012-06-12 LAB — BASIC METABOLIC PANEL
BUN: 18 mg/dL (ref 6–23)
Calcium: 10.2 mg/dL (ref 8.4–10.5)
GFR: 56.99 mL/min — ABNORMAL LOW (ref >60.00)
Potassium: 5 mEq/L (ref 3.5–5.1)

## 2012-06-12 LAB — LDL CHOLESTEROL, DIRECT: Direct LDL: 104.5 mg/dL

## 2012-06-12 NOTE — Progress Notes (Signed)
  Subjective:    Patient ID: [name] [name], female    DOB: 04/26/1940, 72 y.o.   MRN: [id number]  HPI Routine medical follow up  Hypertension.  Treated with Benicar. Home BP consistently < 140/90.  Tends to run higher here. No headaches or chest pain. Compliant with therapy.   Hyperlipidemia  On Crestor 20 mg daily.  No hx of CAD  History of osteoporosis. Takes calcium vitamin D. Vitamin D levels normal last year. She is reluctant to take bisphosphonates.   Past Medical History  Diagnosis Date  . HYPERLIPIDEMIA 07/18/2008  . HYPERTENSION 07/18/2008  . OSTEOPOROSIS 07/25/2008   Past Surgical History  Procedure Laterality Date  . Lumbar disc surgery  1998    reports that she has never smoked. She does not have any smokeless tobacco history on file. Her alcohol and drug histories are not on file. family history includes Aneurysm in her father; Cancer in her paternal aunt and paternal grandmother; Hyperlipidemia in her mother and sister; Hypertension in her mother and sister; and Stroke (age of onset: 81) in her father. No Known Allergies    Review of Systems  Constitutional: Negative for fatigue and unexpected weight change.  Eyes: Negative for visual disturbance.  Respiratory: Negative for cough, chest tightness, shortness of breath and wheezing.   Cardiovascular: Negative for chest pain, palpitations and leg swelling.  Neurological: Negative for dizziness, seizures, syncope, weakness, light-headedness and headaches.       Objective:   Physical Exam  Constitutional: She appears well-developed and well-nourished.  Neck: Neck supple. No thyromegaly present.  Cardiovascular: Normal rate and regular rhythm.  Exam reveals no gallop.   Pulmonary/Chest: Effort normal and breath sounds normal. No respiratory distress. She has no wheezes. She has no rales.  Musculoskeletal: She exhibits no edema.  Lymphadenopathy:    She has no cervical adenopathy.          Assessment & Plan:   #1 hypertension. Probable white coat syndrome. Well controlled by home readings. Continue Benicar 20 mg daily and reassess 6 months. Check basic metabolic panel #2 hyperlipidemia. Check lipid and hepatic panel. Samples of Crestor given. #3 osteoporosis. Continue calcium and vitamin D. She remains reluctant to consider bisphosphonates or other therapy at this time. Continue weightbearing exercise

## 2012-06-16 NOTE — Progress Notes (Signed)
Quick Note:  Pt informed ______ 

## 2012-11-10 ENCOUNTER — Telehealth: Payer: Self-pay | Admitting: Family Medicine

## 2012-11-10 NOTE — Telephone Encounter (Signed)
PT is calling and requesting samples of olmesartan (BENICAR) 40 MG tablet. She states that she takes 20mg , so if you have those that's fine, and if you have 40mg , she cuts them in half. Please assist.

## 2012-11-10 NOTE — Telephone Encounter (Signed)
Pt aware that samples are at the front

## 2012-12-10 ENCOUNTER — Ambulatory Visit (INDEPENDENT_AMBULATORY_CARE_PROVIDER_SITE_OTHER): Payer: Medicare Other | Admitting: Family Medicine

## 2012-12-10 ENCOUNTER — Encounter: Payer: Self-pay | Admitting: Family Medicine

## 2012-12-10 VITALS — BP 142/70 | HR 64 | Temp 97.6°F | Wt 118.0 lb

## 2012-12-10 DIAGNOSIS — I10 Essential (primary) hypertension: Secondary | ICD-10-CM

## 2012-12-10 DIAGNOSIS — M810 Age-related osteoporosis without current pathological fracture: Secondary | ICD-10-CM

## 2012-12-10 DIAGNOSIS — E785 Hyperlipidemia, unspecified: Secondary | ICD-10-CM

## 2012-12-10 NOTE — Patient Instructions (Signed)

## 2012-12-10 NOTE — Progress Notes (Signed)
  Subjective:    Patient ID: [name] [name], female    DOB: 1940-10-03, 72 y.o.   MRN: [id number]  HPI Patient here for followup hypertension and hyperlipidemia. She's on Benicar for hypertension. Well controlled by home readings. Compliant with therapy. No recent headaches or dizziness. Takes Crestor for hyperlipidemia. These were checked 6 months ago and stable and at goal. She has mild osteoporosis. No recent falls. She has decided against any further bisphosphonate therapy. She does regular weightbearing exercise along with vitamin D and calcium. No fractures  She is compliant with all medications. No history of smoking. She wishes to get flu vaccine later. Pneumovax up-to-date.  Past Medical History  Diagnosis Date  . HYPERLIPIDEMIA 07/18/2008  . HYPERTENSION 07/18/2008  . OSTEOPOROSIS 07/25/2008   Past Surgical History  Procedure Laterality Date  . Lumbar disc surgery  1998    reports that she has never smoked. She does not have any smokeless tobacco history on file. Her alcohol and drug histories are not on file. family history includes Aneurysm in her father; Cancer in her paternal aunt and paternal grandmother; Hyperlipidemia in her mother and sister; Hypertension in her mother and sister; Stroke (age of onset: 44) in her father. No Known Allergies    Review of Systems  Constitutional: Negative for appetite change, fatigue and unexpected weight change.  Eyes: Negative for visual disturbance.  Respiratory: Negative for cough, chest tightness, shortness of breath and wheezing.   Cardiovascular: Negative for chest pain, palpitations and leg swelling.  Gastrointestinal: Negative for abdominal pain.  Endocrine: Negative for polydipsia and polyuria.  Genitourinary: Negative for dysuria.  Neurological: Negative for dizziness, seizures, syncope, weakness, light-headedness and headaches.  Psychiatric/Behavioral: Negative for dysphoric mood.       Objective:   Physical Exam   Constitutional: She is oriented to person, place, and time. She appears well-developed and well-nourished.  Neck: Neck supple. No thyromegaly present.  Cardiovascular: Normal rate and regular rhythm.  Exam reveals no gallop.   Pulmonary/Chest: Effort normal and breath sounds normal. No respiratory distress. She has no wheezes. She has no rales.  Musculoskeletal: She exhibits no edema.  Lymphadenopathy:    She has no cervical adenopathy.  Neurological: She is alert and oriented to person, place, and time.          Assessment & Plan:  #1 hypertension. Well controlled. Continue Benicar 20 mg daily. Samples provided #2 hyperlipidemia. Stable. Continue Crestor. Recheck at followup #3 osteoporosis. We again discussed pros and cons of medical therapy. She is against any bisphosphonates or other prescription medications at this time. Continue adequate calcium and vitamin D. Consider vitamin D level at followup

## 2013-02-18 ENCOUNTER — Other Ambulatory Visit: Payer: Self-pay

## 2013-02-22 ENCOUNTER — Other Ambulatory Visit: Payer: Self-pay | Admitting: Family Medicine

## 2013-03-05 ENCOUNTER — Other Ambulatory Visit: Payer: Self-pay

## 2013-03-05 DIAGNOSIS — Z1231 Encounter for screening mammogram for malignant neoplasm of breast: Secondary | ICD-10-CM

## 2013-04-05 ENCOUNTER — Ambulatory Visit
Admission: RE | Admit: 2013-04-05 | Discharge: 2013-04-05 | Disposition: A | Discharge status: Home / Self Care | Payer: Medicare Other | Source: Ambulatory Visit

## 2013-04-05 DIAGNOSIS — Z1231 Encounter for screening mammogram for malignant neoplasm of breast: Secondary | ICD-10-CM

## 2013-06-14 ENCOUNTER — Encounter: Payer: Self-pay | Admitting: Family Medicine

## 2013-06-14 ENCOUNTER — Ambulatory Visit (INDEPENDENT_AMBULATORY_CARE_PROVIDER_SITE_OTHER): Payer: Medicare HMO | Admitting: Family Medicine

## 2013-06-14 VITALS — BP 140/80 | HR 64 | Wt 121.0 lb

## 2013-06-14 DIAGNOSIS — I10 Essential (primary) hypertension: Secondary | ICD-10-CM

## 2013-06-14 DIAGNOSIS — M810 Age-related osteoporosis without current pathological fracture: Secondary | ICD-10-CM

## 2013-06-14 DIAGNOSIS — E785 Hyperlipidemia, unspecified: Secondary | ICD-10-CM

## 2013-06-14 LAB — BASIC METABOLIC PANEL
BUN: 15 mg/dL (ref 6–23)
CO2: 29 meq/L (ref 19–32)
Calcium: 9.9 mg/dL (ref 8.4–10.5)
Chloride: 99 mEq/L (ref 96–112)
Creatinine, Ser: 1 mg/dL (ref 0.4–1.2)
GFR: 58.16 mL/min — ABNORMAL LOW (ref >60.00)
GLUCOSE: 106 mg/dL — AB (ref 70–99)
POTASSIUM: 4.5 meq/L (ref 3.5–5.1)
SODIUM: 137 meq/L (ref 135–145)

## 2013-06-14 LAB — HEPATIC FUNCTION PANEL
ALK PHOS: 55 U/L (ref 39–117)
ALT: 16 U/L (ref 0–35)
AST: 25 U/L (ref 0–37)
Albumin: 4.2 g/dL (ref 3.5–5.2)
Bilirubin, Direct: 0 mg/dL (ref 0.0–0.3)
TOTAL PROTEIN: 7 g/dL (ref 6.0–8.3)
Total Bilirubin: 0.9 mg/dL (ref 0.3–1.2)

## 2013-06-14 LAB — LIPID PANEL
Cholesterol: 230 mg/dL — ABNORMAL HIGH (ref 0–200)
HDL: 103.3 mg/dL (ref >39.00)
Total CHOL/HDL Ratio: 2
Triglycerides: 62 mg/dL (ref 0.0–149.0)
VLDL: 12.4 mg/dL (ref 0.0–40.0)

## 2013-06-14 LAB — LDL CHOLESTEROL, DIRECT: Direct LDL: 116.6 mg/dL

## 2013-06-14 NOTE — Progress Notes (Signed)
Pre visit review using our clinic review tool, if applicable. No additional management support is needed unless otherwise documented below in the visit note. 

## 2013-06-14 NOTE — Progress Notes (Signed)
   Subjective:    Patient ID: [name] [name], female    DOB: 02/01/1941, 73 y.o.   MRN: [id number]  HPI Patient here for followup. She has history of hypertension and hyperlipidemia. Medications reviewed. Compliant with all. Denies any side effects. No recent dizziness, headaches, dyspnea, or chest pains. Her father died of what sounds like cerebral aneurysm age 75. Nodes no family history of premature CAD. Patient never smoked. No recent falls. She has no history of any cardiac disease or cerebrovascular disease. She has osteoporosis and takes regular calcium and vitamin D. She stays very active physically. She has previously taken bisphosphonate for several years and has declined further therapy  Past Medical History  Diagnosis Date  . HYPERLIPIDEMIA 07/18/2008  . HYPERTENSION 07/18/2008  . OSTEOPOROSIS 07/25/2008   Past Surgical History  Procedure Laterality Date  . Lumbar disc surgery  1998    reports that she has never smoked. She does not have any smokeless tobacco history on file. Her alcohol and drug histories are not on file. family history includes Aneurysm in her father; Cancer in her paternal aunt and paternal grandmother; Hyperlipidemia in her mother and sister; Hypertension in her mother and sister; Stroke (age of onset: 8947) in her father. No Known Allergies    Review of Systems  Constitutional: Negative for appetite change, fatigue and unexpected weight change.  Eyes: Negative for visual disturbance.  Respiratory: Negative for cough, chest tightness, shortness of breath and wheezing.   Cardiovascular: Negative for chest pain, palpitations and leg swelling.  Endocrine: Negative for polydipsia and polyuria.  Neurological: Negative for dizziness, seizures, syncope, weakness, light-headedness and headaches.       Objective:   Physical Exam  Constitutional: She is oriented to person, place, and time. She appears well-developed and well-nourished.  HENT:  Head: Normocephalic and  atraumatic.  Right Ear: External ear normal.  Left Ear: External ear normal.  Eyes: EOM are normal. Pupils are equal, round, and reactive to light.  Neck: Normal range of motion. Neck supple. No thyromegaly present.  Cardiovascular: Normal rate, regular rhythm and normal heart sounds.  Exam reveals no gallop.   No murmur heard. Pulmonary/Chest: Effort normal and breath sounds normal. No respiratory distress. She has no wheezes. She has no rales.  Musculoskeletal: Normal range of motion. She exhibits no edema.  Lymphadenopathy:    She has no cervical adenopathy.  Neurological: She is alert and oriented to person, place, and time.  Skin: No rash noted.  Psychiatric: She has a normal mood and affect. Her behavior is normal. Judgment and thought content normal.          Assessment & Plan:  #1 hypertension. Adequate control. Continue Benicar 20 mg daily #2 hyperlipidemia. Recheck lipid and hepatic panel. Provided further samples of Crestor #3 osteoporosis. Continue adequate calcium and vitamin D and weightbearing exercise. We discussed pros and cons of bisphosphonate therapy and she declines at this point

## 2013-06-15 ENCOUNTER — Telehealth: Payer: Self-pay | Admitting: Family Medicine

## 2013-06-15 NOTE — Telephone Encounter (Signed)
Relevant patient education assigned to patient using Emmi. ° °

## 2013-10-19 ENCOUNTER — Encounter: Payer: Self-pay | Admitting: Family Medicine

## 2013-10-20 ENCOUNTER — Other Ambulatory Visit: Payer: Self-pay

## 2013-10-20 MED ORDER — OLMESARTAN MEDOXOMIL 40 MG PO TABS: ORAL_TABLET | ORAL | Status: DC

## 2013-12-14 ENCOUNTER — Encounter: Payer: Self-pay | Admitting: Family Medicine

## 2013-12-14 ENCOUNTER — Ambulatory Visit (INDEPENDENT_AMBULATORY_CARE_PROVIDER_SITE_OTHER): Payer: Medicare HMO | Admitting: Family Medicine

## 2013-12-14 VITALS — BP 138/78 | HR 70 | Temp 97.6°F | Wt 120.0 lb

## 2013-12-14 DIAGNOSIS — I10 Essential (primary) hypertension: Secondary | ICD-10-CM

## 2013-12-14 DIAGNOSIS — Z23 Encounter for immunization: Secondary | ICD-10-CM

## 2013-12-14 DIAGNOSIS — E785 Hyperlipidemia, unspecified: Secondary | ICD-10-CM

## 2013-12-14 NOTE — Progress Notes (Signed)
   Subjective:    Patient ID: [name] [name], female    DOB: [birth year], 73 y.o.   MRN: [id number]  HPI Medical followup. She has hypertension and hyperlipidemia. Medications reviewed. Compliant with all. No side effects. Denies recent chest pains, headaches, dyspnea, or dizziness. No recent falls. She has osteoporosis and takes calcium and vitamin D. Walks for exercise. She's declined other osteoporosis therapy. Needs flu vaccine. Needs Prevnar 13 vaccination.  Past Medical History  Diagnosis Date  . HYPERLIPIDEMIA 07/18/2008  . HYPERTENSION 07/18/2008  . OSTEOPOROSIS 07/25/2008   Past Surgical History  Procedure Laterality Date  . Lumbar disc surgery  1998    reports that she has never smoked. She does not have any smokeless tobacco history on file. Her alcohol and drug histories are not on file. family history includes Aneurysm in her father; Cancer in her paternal aunt and paternal grandmother; Hyperlipidemia in her mother and sister; Hypertension in her mother and sister; Stroke (age of onset: 83) in her father. No Known Allergies    Review of Systems  Constitutional: Negative for fatigue.  Eyes: Negative for visual disturbance.  Respiratory: Negative for cough, chest tightness, shortness of breath and wheezing.   Cardiovascular: Negative for chest pain, palpitations and leg swelling.  Neurological: Negative for dizziness, seizures, syncope, weakness, light-headedness and headaches.       Objective:   Physical Exam  Constitutional: She appears well-developed and well-nourished.  Neck: Neck supple. No thyromegaly present.  Cardiovascular: Normal rate and regular rhythm.   Pulmonary/Chest: Effort normal and breath sounds normal. No respiratory distress. She has no wheezes. She has no rales.  Musculoskeletal: She exhibits no edema.          Assessment & Plan:  #1 hypertension. Stable. Continue Benicar  #2 hyperlipidemia. History of excellent HDL. Continue Crestor. Recheck  lipids at followup  #3 health maintenance. Flu vaccine and Prevnar 13 given

## 2013-12-14 NOTE — Progress Notes (Signed)
Pre visit review using our clinic review tool, if applicable. No additional management support is needed unless otherwise documented below in the visit note. 

## 2013-12-14 NOTE — Addendum Note (Signed)
Addended by: Thomasena Edis on: 12/14/2013 08:59 AM   Modules accepted: Orders

## 2014-01-18 ENCOUNTER — Encounter: Payer: Self-pay | Admitting: Family Medicine

## 2014-01-19 ENCOUNTER — Other Ambulatory Visit: Payer: Self-pay

## 2014-01-19 MED ORDER — LOSARTAN POTASSIUM 50 MG PO TABS: 50.0000 mg | ORAL_TABLET | Freq: Every day | ORAL | Status: DC

## 2014-01-19 MED ORDER — ATORVASTATIN CALCIUM 20 MG PO TABS: 20.0000 mg | ORAL_TABLET | Freq: Every day | ORAL | Status: DC

## 2014-01-28 ENCOUNTER — Other Ambulatory Visit: Payer: Self-pay

## 2014-03-09 ENCOUNTER — Encounter: Payer: Self-pay | Admitting: Family Medicine

## 2014-03-09 ENCOUNTER — Ambulatory Visit (INDEPENDENT_AMBULATORY_CARE_PROVIDER_SITE_OTHER): Payer: Medicare HMO | Admitting: Family Medicine

## 2014-03-09 VITALS — BP 130/80 | HR 72 | Temp 97.6°F | Wt 120.0 lb

## 2014-03-09 DIAGNOSIS — I10 Essential (primary) hypertension: Secondary | ICD-10-CM

## 2014-03-09 DIAGNOSIS — E785 Hyperlipidemia, unspecified: Secondary | ICD-10-CM

## 2014-03-09 NOTE — Progress Notes (Signed)
Pre visit review using our clinic review tool, if applicable. No additional management support is needed unless otherwise documented below in the visit note. 

## 2014-03-09 NOTE — Progress Notes (Signed)
   Subjective:    Patient ID: [name] [name], female    DOB: 06/22/1940, 73 y.o.   MRN: [id number]  HPI Patient seen for medical follow-up. She because of insurance issues needed to change medications for cost reasons. We switched Benicar to losartan and Crestor to Lipitor. She is tolerating both. Does not monitor blood pressures regularly but no side effects. No myalgias or other concerns with Lipitor. Generally, her blood pressure been well controlled. No dizziness. No headaches. No chest pains. Compliant with therapy.  Past Medical History  Diagnosis Date  . HYPERLIPIDEMIA 07/18/2008  . HYPERTENSION 07/18/2008  . OSTEOPOROSIS 07/25/2008   Past Surgical History  Procedure Laterality Date  . Lumbar disc surgery  1998    reports that she has never smoked. She does not have any smokeless tobacco history on file. Her alcohol and drug histories are not on file. family history includes Aneurysm in her father; Cancer in her paternal aunt and paternal grandmother; Hyperlipidemia in her mother and sister; Hypertension in her mother and sister; Stroke (age of onset: 9247) in her father. No Known Allergies    Review of Systems  Constitutional: Negative for fatigue.  Eyes: Negative for visual disturbance.  Respiratory: Negative for cough, chest tightness, shortness of breath and wheezing.   Cardiovascular: Negative for chest pain, palpitations and leg swelling.  Neurological: Negative for dizziness, seizures, syncope, weakness, light-headedness and headaches.       Objective:   Physical Exam  Constitutional: She appears well-developed and well-nourished. No distress.  Neck: Neck supple. No thyromegaly present.  Cardiovascular: Normal rate and regular rhythm.   Pulmonary/Chest: Effort normal and breath sounds normal. No respiratory distress. She has no wheezes. She has no rales.  Musculoskeletal: She exhibits no edema.  Lymphadenopathy:    She has no cervical adenopathy.          Assessment &  Plan:  #1 hypertension. By my reading this was elevated 160/78. By nurse reading this was fairly well controlled. Question white coat syndrome. We recommended close monitoring and bring her cuff to compare with ours next visit. If home readings consistently over 150/90 follow-up sooner #2 hyperlipidemia. Tolerating Lipitor no side effects. Recheck lipids at follow-up

## 2014-03-09 NOTE — Patient Instructions (Signed)
Bring your BP cuff at follow up visit Monitor blood pressure and be in touch if consistently > 150/90

## 2014-03-11 ENCOUNTER — Ambulatory Visit: Payer: Self-pay | Admitting: Family Medicine

## 2014-06-14 ENCOUNTER — Encounter: Payer: Self-pay | Admitting: Family Medicine

## 2014-06-14 ENCOUNTER — Ambulatory Visit (INDEPENDENT_AMBULATORY_CARE_PROVIDER_SITE_OTHER): Payer: Commercial Managed Care - HMO | Admitting: Family Medicine

## 2014-06-14 VITALS — BP 134/80 | HR 74 | Temp 97.9°F | Wt 121.0 lb

## 2014-06-14 DIAGNOSIS — E785 Hyperlipidemia, unspecified: Secondary | ICD-10-CM

## 2014-06-14 DIAGNOSIS — I10 Essential (primary) hypertension: Secondary | ICD-10-CM | POA: Diagnosis not present

## 2014-06-14 DIAGNOSIS — M810 Age-related osteoporosis without current pathological fracture: Secondary | ICD-10-CM

## 2014-06-14 LAB — LIPID PANEL
Cholesterol: 242 mg/dL — ABNORMAL HIGH (ref 0–200)
HDL: 110.7 mg/dL (ref >39.00)
LDL Cholesterol: 115 mg/dL — ABNORMAL HIGH (ref 0–99)
NONHDL: 131.3
Total CHOL/HDL Ratio: 2
Triglycerides: 83 mg/dL (ref 0.0–149.0)
VLDL: 16.6 mg/dL (ref 0.0–40.0)

## 2014-06-14 LAB — HEPATIC FUNCTION PANEL
ALBUMIN: 4.5 g/dL (ref 3.5–5.2)
ALK PHOS: 70 U/L (ref 39–117)
ALT: 17 U/L (ref 0–35)
AST: 23 U/L (ref 0–37)
Bilirubin, Direct: 0.2 mg/dL (ref 0.0–0.3)
TOTAL PROTEIN: 7.2 g/dL (ref 6.0–8.3)
Total Bilirubin: 0.8 mg/dL (ref 0.2–1.2)

## 2014-06-14 LAB — BASIC METABOLIC PANEL
BUN: 12 mg/dL (ref 6–23)
CO2: 33 meq/L — AB (ref 19–32)
CREATININE: 0.94 mg/dL (ref 0.40–1.20)
Calcium: 9.9 mg/dL (ref 8.4–10.5)
Chloride: 100 mEq/L (ref 96–112)
GFR: 61.58 mL/min (ref >60.00)
GLUCOSE: 111 mg/dL — AB (ref 70–99)
Potassium: 4 mEq/L (ref 3.5–5.1)
Sodium: 139 mEq/L (ref 135–145)

## 2014-06-14 NOTE — Progress Notes (Signed)
   Subjective:    Patient ID: [name] [name], female    DOB: 08/07/1940, 74 y.o.   MRN: [id number]  HPI Patient seen for medical follow-up. She has history of hyperlipidemia but excellent HDL. She has been on atorvastatin for many years. No myalgias. No history of CAD. Hypertension treated with losartan. Well controlled by home readings. No dizziness or headaches. Compliant with medications. She also takes baby aspirin 1 daily  History of osteoporosis. She takes calcium and vitamin D. Walks regular for exercise. No recent falls.  She had been on Fosamax for several years but has now been off this for several years. She declines further therapy and also declines further osteoporosis screening  Past Medical History  Diagnosis Date  . HYPERLIPIDEMIA 07/18/2008  . HYPERTENSION 07/18/2008  . OSTEOPOROSIS 07/25/2008   Past Surgical History  Procedure Laterality Date  . Lumbar disc surgery  1998    reports that she has never smoked. She does not have any smokeless tobacco history on file. Her alcohol and drug histories are not on file. family history includes Aneurysm in her father; Cancer in her paternal aunt and paternal grandmother; Hyperlipidemia in her mother and sister; Hypertension in her mother and sister; Stroke (age of onset: 4947) in her father. No Known Allergies   Review of Systems  Constitutional: Negative for fatigue.  Eyes: Negative for visual disturbance.  Respiratory: Negative for cough, chest tightness, shortness of breath and wheezing.   Cardiovascular: Negative for chest pain, palpitations and leg swelling.  Endocrine: Negative for polydipsia and polyuria.  Neurological: Negative for dizziness, seizures, syncope, weakness, light-headedness and headaches.       Objective:   Physical Exam  Constitutional: She is oriented to person, place, and time. She appears well-developed and well-nourished.  Neck: Neck supple. No thyromegaly present.  Cardiovascular: Normal rate and regular  rhythm.  Exam reveals no gallop.   Pulmonary/Chest: Effort normal and breath sounds normal. No respiratory distress. She has no wheezes. She has no rales.  Musculoskeletal: She exhibits no edema.  Lymphadenopathy:    She has no cervical adenopathy.  Neurological: She is alert and oriented to person, place, and time.          Assessment & Plan:  #1 hypertension. Stable by home readings and here today as well. Continue losartan #2 hyperlipidemia. Continue Lipitor. Recheck lipid and hepatic panel #3 osteoporosis. We discussed possible repeat DEXA scan. She declines further therapy and DEXA scan. Continue regular calcium and vitamin D and weightbearing exercise.

## 2014-06-14 NOTE — Progress Notes (Signed)
Pre visit review using our clinic review tool, if applicable. No additional management support is needed unless otherwise documented below in the visit note. 

## 2014-06-21 ENCOUNTER — Telehealth: Payer: Self-pay | Admitting: Family Medicine

## 2014-06-21 NOTE — Telephone Encounter (Signed)
[facility] - Client TELEPHONE ADVICE RECORD [facility] Patient Name: [name] [name] DOB: 05/11/1940 Initial Comment Caller states c/o UTI symptoms, requests Rx Nurse Assessment Nurse: Chrys RacerKoenig, RN, Alexia FreestoneAnna Marie Date/Time Lamount Cohen(Eastern Time): 06/21/2014 2:44:30 PM Confirm and document reason for call. If symptomatic, describe symptoms. ---Caller states c/o UTI symptoms, requests Rx. Afebrile. Pt has some pelvic pain, and dysuria. No blood seen.Voiding normal amounts Has the patient traveled out of the country within the last 30 days? ---No Does the patient require triage? ---Yes Related visit to physician within the last 2 weeks? ---Yes Does the PT have any chronic conditions? (i.e. diabetes, asthma, etc.) ---Yes List chronic conditions. ---high blood pressure, high cholesterol Guidelines Guideline Title Affirmed Question Affirmed Notes Urination Pain - Female > 2 UTI's in last year Final Disposition User See Physician within 24 Hours Deep WaterKoenig, RN, Alexia Freestonenna Marie Comments pt has appt w Dr Caryl NeverBurchette at 09:45 tomorrow morning to be evaluated for a UTI

## 2014-06-22 ENCOUNTER — Ambulatory Visit (INDEPENDENT_AMBULATORY_CARE_PROVIDER_SITE_OTHER): Payer: Commercial Managed Care - HMO | Admitting: Family Medicine

## 2014-06-22 ENCOUNTER — Encounter: Payer: Self-pay | Admitting: Family Medicine

## 2014-06-22 DIAGNOSIS — R300 Dysuria: Secondary | ICD-10-CM

## 2014-06-22 LAB — POCT URINALYSIS DIPSTICK
Bilirubin, UA: NEGATIVE
GLUCOSE UA: NEGATIVE
Ketones, UA: NEGATIVE
LEUKOCYTES UA: NEGATIVE
NITRITE UA: NEGATIVE
PROTEIN UA: NEGATIVE
Spec Grav, UA: <=1.005
UROBILINOGEN UA: 0.2
pH, UA: 6.5

## 2014-06-22 MED ORDER — CEPHALEXIN 500 MG PO CAPS: 500.0000 mg | ORAL_CAPSULE | Freq: Three times a day (TID) | ORAL | Status: DC

## 2014-06-22 NOTE — Progress Notes (Signed)
   Subjective:    Patient ID: [name] [name], female    DOB: 11/20/1940, 74 y.o.   MRN: [id number]  HPI  Acute visit. Patient seen with 3 day history of dysuria. She's had some slight frequency but consistent burning with urination. Mild suprapubic discomfort. Denies any flank pain, fever, chills, nausea, or vomiting. No gross hematuria.  Past Medical History  Diagnosis Date  . HYPERLIPIDEMIA 07/18/2008  . HYPERTENSION 07/18/2008  . OSTEOPOROSIS 07/25/2008   Past Surgical History  Procedure Laterality Date  . Lumbar disc surgery  1998    reports that she has never smoked. She does not have any smokeless tobacco history on file. Her alcohol and drug histories are not on file. family history includes Aneurysm in her father; Cancer in her paternal aunt and paternal grandmother; Hyperlipidemia in her mother and sister; Hypertension in her mother and sister; Stroke (age of onset: 847) in her father. No Known Allergies    Review of Systems  Constitutional: Negative for fever, chills and appetite change.  Gastrointestinal: Negative for nausea, vomiting, abdominal pain, diarrhea and constipation.  Genitourinary: Positive for dysuria and frequency. Negative for hematuria and flank pain.  Musculoskeletal: Negative for back pain.  Neurological: Negative for dizziness.       Objective:   Physical Exam  Constitutional: She appears well-developed and well-nourished.  HENT:  Head: Normocephalic and atraumatic.  Neck: Neck supple. No thyromegaly present.  Cardiovascular: Normal rate, regular rhythm and normal heart sounds.   Pulmonary/Chest: Breath sounds normal.  Abdominal: Soft. Bowel sounds are normal.  Minimal suprapubic tenderness. No guarding or rebound          Assessment & Plan:  Dysuria. Urine dipstick does not reveal any leukocytes or nitrites but she does have 1+ blood. Urine culture sent. Keflex 500 mg 3 times a day pending culture results. If culture negative, repeat urine in 2  weeks to make sure no hematuria and micro-if persist at that time

## 2014-06-22 NOTE — Patient Instructions (Signed)
Drink plenty of fluids Follow up for any fever, vomiting, gross blood in urine, or worsening pain.

## 2014-06-22 NOTE — Telephone Encounter (Signed)
appt scheduled

## 2014-06-22 NOTE — Progress Notes (Signed)
Pre visit review using our clinic review tool, if applicable. No additional management support is needed unless otherwise documented below in the visit note. 

## 2014-06-24 LAB — URINE CULTURE

## 2014-07-06 ENCOUNTER — Encounter: Payer: Self-pay | Admitting: Family Medicine

## 2014-07-22 ENCOUNTER — Other Ambulatory Visit (INDEPENDENT_AMBULATORY_CARE_PROVIDER_SITE_OTHER): Payer: Commercial Managed Care - HMO

## 2014-07-22 ENCOUNTER — Other Ambulatory Visit: Payer: Self-pay | Admitting: Family Medicine

## 2014-07-22 DIAGNOSIS — R300 Dysuria: Secondary | ICD-10-CM

## 2014-07-22 LAB — POCT URINALYSIS DIPSTICK
Bilirubin, UA: NEGATIVE
GLUCOSE UA: NEGATIVE
Ketones, UA: NEGATIVE
LEUKOCYTES UA: NEGATIVE
Nitrite, UA: NEGATIVE
Protein, UA: NEGATIVE
Spec Grav, UA: 1.01
UROBILINOGEN UA: 0.2
pH, UA: 5.5

## 2014-07-22 LAB — URINALYSIS, MICROSCOPIC ONLY

## 2014-07-25 ENCOUNTER — Other Ambulatory Visit: Payer: Self-pay

## 2014-07-25 ENCOUNTER — Encounter: Payer: Self-pay | Admitting: Family Medicine

## 2014-07-25 MED ORDER — LOSARTAN POTASSIUM 50 MG PO TABS: 50.0000 mg | ORAL_TABLET | Freq: Every day | ORAL | Status: DC

## 2014-07-25 MED ORDER — ATORVASTATIN CALCIUM 20 MG PO TABS: 20.0000 mg | ORAL_TABLET | Freq: Every day | ORAL | Status: DC

## 2014-12-12 ENCOUNTER — Other Ambulatory Visit: Payer: Self-pay | Admitting: Family Medicine

## 2015-02-25 DIAGNOSIS — R300 Dysuria: Secondary | ICD-10-CM | POA: Diagnosis not present

## 2015-02-25 DIAGNOSIS — M545 Low back pain: Secondary | ICD-10-CM | POA: Diagnosis not present

## 2015-04-17 ENCOUNTER — Other Ambulatory Visit: Payer: Self-pay | Admitting: Family Medicine

## 2015-06-16 ENCOUNTER — Ambulatory Visit (INDEPENDENT_AMBULATORY_CARE_PROVIDER_SITE_OTHER): Payer: Commercial Managed Care - HMO | Admitting: Family Medicine

## 2015-06-16 VITALS — BP 170/100 | HR 86 | Temp 97.8°F | Ht 63.5 in | Wt 122.2 lb

## 2015-06-16 DIAGNOSIS — Z0001 Encounter for general adult medical examination with abnormal findings: Secondary | ICD-10-CM | POA: Diagnosis not present

## 2015-06-16 DIAGNOSIS — I10 Essential (primary) hypertension: Secondary | ICD-10-CM

## 2015-06-16 DIAGNOSIS — E785 Hyperlipidemia, unspecified: Secondary | ICD-10-CM

## 2015-06-16 DIAGNOSIS — M810 Age-related osteoporosis without current pathological fracture: Secondary | ICD-10-CM

## 2015-06-16 LAB — LIPID PANEL
CHOL/HDL RATIO: 2
Cholesterol: 242 mg/dL — ABNORMAL HIGH (ref 0–200)
HDL: 105.1 mg/dL (ref >39.00)
LDL Cholesterol: 122 mg/dL — ABNORMAL HIGH (ref 0–99)
NONHDL: 136.79
TRIGLYCERIDES: 75 mg/dL (ref 0.0–149.0)
VLDL: 15 mg/dL (ref 0.0–40.0)

## 2015-06-16 LAB — BASIC METABOLIC PANEL
BUN: 15 mg/dL (ref 6–23)
CALCIUM: 10.2 mg/dL (ref 8.4–10.5)
CO2: 34 meq/L — AB (ref 19–32)
CREATININE: 0.98 mg/dL (ref 0.40–1.20)
Chloride: 99 mEq/L (ref 96–112)
GFR: 58.53 mL/min — AB (ref >60.00)
GLUCOSE: 110 mg/dL — AB (ref 70–99)
Potassium: 4.6 mEq/L (ref 3.5–5.1)
SODIUM: 140 meq/L (ref 135–145)

## 2015-06-16 LAB — HEPATIC FUNCTION PANEL
ALBUMIN: 4.6 g/dL (ref 3.5–5.2)
ALK PHOS: 62 U/L (ref 39–117)
ALT: 14 U/L (ref 0–35)
AST: 21 U/L (ref 0–37)
BILIRUBIN DIRECT: 0.2 mg/dL (ref 0.0–0.3)
Total Bilirubin: 1 mg/dL (ref 0.2–1.2)
Total Protein: 6.7 g/dL (ref 6.0–8.3)

## 2015-06-16 LAB — VITAMIN D 25 HYDROXY (VIT D DEFICIENCY, FRACTURES): VITD: 41.7 ng/mL (ref 30.00–100.00)

## 2015-06-16 NOTE — Patient Instructions (Signed)
Consider bone density scan.  Let me know if interested Consider check on coverage for shingles vaccine. Monitor blood pressure over next two weeks and record home readings and bring in for review Bring your cuff with you to follow up in two weeks.

## 2015-06-16 NOTE — Progress Notes (Signed)
Subjective:    Patient ID: [name] [name], female    DOB: [birth year], 75 y.o.   MRN: [id number]  HPI Patient is here for complete physical. We also addressed additional unstable/uncontrolled medical problems as below (elevated blood pressure)  Patient has chronic problems of hypertension, osteoporosis, hyperlipidemia. She took Fosamax years ago for osteoporosis but has declined further therapy. She also declines further bone density at this time. She is very active and takes regular calcium and vitamin D. No history of shingles vaccine. Other vaccines up-to-date. She's not had recent mammogram.  History of reported "white coat syndrome ". She takes losartan is states she is compliant with therapy. No headaches or chest pains. No peripheral edema. Has home blood pressure cuff but not monitoring. She has hyperlipidemia treated with atorvastatin but excellent HDL. No history of CAD or peripheral vascular disease. Never smoked. Remains on baby aspirin. No alcohol use.  Past Medical History  Diagnosis Date  . HYPERLIPIDEMIA 07/18/2008  . HYPERTENSION 07/18/2008  . OSTEOPOROSIS 07/25/2008   Past Surgical History  Procedure Laterality Date  . Lumbar disc surgery  1998    reports that she has never smoked. She does not have any smokeless tobacco history on file. Her alcohol and drug histories are not on file. family history includes Aneurysm in her father; Cancer in her paternal aunt and paternal grandmother; Hyperlipidemia in her mother and sister; Hypertension in her mother and sister; Stroke (age of onset: 11) in her father. No Known Allergies    Review of Systems  Constitutional: Negative for fever, activity change, appetite change, fatigue and unexpected weight change.  HENT: Negative for ear pain, hearing loss, sore throat and trouble swallowing.   Eyes: Negative for visual disturbance.  Respiratory: Negative for cough and shortness of breath.   Cardiovascular: Negative for chest pain and  palpitations.  Gastrointestinal: Negative for abdominal pain, diarrhea, constipation and blood in stool.  Endocrine: Negative for polydipsia and polyuria.  Genitourinary: Negative for dysuria and hematuria.  Musculoskeletal: Negative for myalgias, back pain and arthralgias.  Skin: Negative for rash.  Neurological: Negative for dizziness, syncope and headaches.  Hematological: Negative for adenopathy.  Psychiatric/Behavioral: Negative for confusion and dysphoric mood.       Objective:   Physical Exam  Constitutional: She is oriented to person, place, and time. She appears well-developed and well-nourished.  HENT:  Head: Normocephalic and atraumatic.  Eyes: EOM are normal. Pupils are equal, round, and reactive to light.  Neck: Normal range of motion. Neck supple. No thyromegaly present.  Cardiovascular: Normal rate, regular rhythm and normal heart sounds.   No murmur heard. Pulmonary/Chest: Breath sounds normal. No respiratory distress. She has no wheezes. She has no rales.  Abdominal: Soft. Bowel sounds are normal. She exhibits no distension and no mass. There is no tenderness. There is no rebound and no guarding.  Musculoskeletal: Normal range of motion. She exhibits no edema.  Lymphadenopathy:    She has no cervical adenopathy.  Neurological: She is alert and oriented to person, place, and time. She displays normal reflexes. No cranial nerve deficit.  Skin: No rash noted.  Psychiatric: She has a normal mood and affect. Her behavior is normal. Judgment and thought content normal.          Assessment & Plan:  #1 physical exam. We offered shingles vaccine and she declines. We discussed follow-up bone density testing. She has known osteoporosis. She declines. Continue regular calcium and vitamin D and weightbearing exercise.she will consider repeat mammogram  #  2 hypertension poorly controlled. Repeat left arm seated after rest 180/88.we discussed many options including going ahead  and adding additional medication versus 24-hour ambulatory monitoring versus  close monitoring at home and bring back her cuff in 2 weeks to compare with ours and she prefers the latter.  Consider addition of amlodipine if blood pressure remains poorly controlled at that time

## 2015-06-16 NOTE — Progress Notes (Signed)
Pre visit review using our clinic review tool, if applicable. No additional management support is needed unless otherwise documented below in the visit note. 

## 2015-06-18 ENCOUNTER — Encounter: Payer: Self-pay | Admitting: Family Medicine

## 2015-06-22 ENCOUNTER — Encounter: Payer: Self-pay | Admitting: Family Medicine

## 2015-06-22 NOTE — Telephone Encounter (Signed)
Please call pt and schedule appt for blood pressure this week or Monday.

## 2015-06-23 ENCOUNTER — Ambulatory Visit (INDEPENDENT_AMBULATORY_CARE_PROVIDER_SITE_OTHER): Payer: Commercial Managed Care - HMO | Admitting: Family Medicine

## 2015-06-23 ENCOUNTER — Encounter: Payer: Self-pay | Admitting: Family Medicine

## 2015-06-23 VITALS — BP 180/110 | HR 90 | Temp 97.7°F | Ht 63.5 in | Wt 123.1 lb

## 2015-06-23 DIAGNOSIS — I10 Essential (primary) hypertension: Secondary | ICD-10-CM

## 2015-06-23 MED ORDER — AMLODIPINE BESYLATE 5 MG PO TABS: 5.0000 mg | ORAL_TABLET | Freq: Every day | ORAL | Status: DC

## 2015-06-23 NOTE — Progress Notes (Signed)
   Subjective:    Patient ID: [name] [name], female    DOB: 05/22/1940, 75 y.o.   MRN: [id number]  HPI  Patient here for evaluation of hypertension. She had recent physical and had significant BP elevation that day and patient suspected white coat syndrome. We encouraged her to monitor closely at home. She called back yesterday with several readings between 160-194 systolic and 80-106 diastolic. She has not had any headaches. No chest pains. No dizziness.   Takes losartan 50 mg daily and compliant with therapy. She drinks about 1 glass of wine 4-6 ounces per day but never more. Hyperlipidemia treated with Lipitor. Also takes baby aspirin. No other nonsteroidal use. No recent peripheral edema.  Past Medical History  Diagnosis Date  . HYPERLIPIDEMIA 07/18/2008  . HYPERTENSION 07/18/2008  . OSTEOPOROSIS 07/25/2008   Past Surgical History  Procedure Laterality Date  . Lumbar disc surgery  1998    reports that she has never smoked. She does not have any smokeless tobacco history on file. Her alcohol and drug histories are not on file. family history includes Aneurysm in her father; Cancer in her paternal aunt and paternal grandmother; Hyperlipidemia in her mother and sister; Hypertension in her mother and sister; Stroke (age of onset: 5047) in her father. No Known Allergies    Review of Systems  Constitutional: Negative for fatigue.  Eyes: Negative for visual disturbance.  Respiratory: Negative for cough, chest tightness, shortness of breath and wheezing.   Cardiovascular: Negative for chest pain, palpitations and leg swelling.  Gastrointestinal: Negative for abdominal pain.  Endocrine: Negative for polydipsia and polyuria.  Genitourinary: Negative for dysuria.  Neurological: Negative for dizziness, seizures, syncope, weakness, light-headedness and headaches.  Psychiatric/Behavioral: Negative for confusion.       Objective:   Physical Exam  Constitutional: She is oriented to person, place,  and time. She appears well-developed and well-nourished.  Neck: Neck supple. No thyromegaly present.  Cardiovascular: Normal rate and regular rhythm.   Pulmonary/Chest: Effort normal and breath sounds normal. No respiratory distress. She has no wheezes. She has no rales.  Musculoskeletal: She exhibits no edema.  Lymphadenopathy:    She has no cervical adenopathy.  Neurological: She is alert and oriented to person, place, and time. No cranial nerve deficit.          Assessment & Plan:   Severe hypertension. Blood pressure confirmed on multiple readings here and we obtained very similar readings with her cuff. Continue losartan. Add amlodipine 5 mg daily. Avoid heavy exertional activity until better stabilized. Watch sodium intake.   Advised no more than 4-6 ounces of wine per day Schedule follow-up in 3 weeks to reassess

## 2015-06-23 NOTE — Progress Notes (Signed)
Pre visit review using our clinic review tool, if applicable. No additional management support is needed unless otherwise documented below in the visit note. 

## 2015-06-23 NOTE — Patient Instructions (Signed)
Hypertension Hypertension, commonly called high blood pressure, is when the force of blood pumping through your arteries is too strong. Your arteries are the blood vessels that carry blood from your heart throughout your body. A blood pressure reading consists of a higher number over a lower number, such as 110/72. The higher number (systolic) is the pressure inside your arteries when your heart pumps. The lower number (diastolic) is the pressure inside your arteries when your heart relaxes. Ideally you want your blood pressure below 120/80. Hypertension forces your heart to work harder to pump blood. Your arteries may become narrow or stiff. Having untreated or uncontrolled hypertension can cause heart attack, stroke, kidney disease, and other problems. RISK FACTORS Some risk factors for high blood pressure are controllable. Others are not.  Risk factors you cannot control include:   Race. You may be at higher risk if you are African American.  Age. Risk increases with age.  Gender. Men are at higher risk than women before age 45 years. After age 65, women are at higher risk than men. Risk factors you can control include:  Not getting enough exercise or physical activity.  Being overweight.  Getting too much fat, sugar, calories, or salt in your diet.  Drinking too much alcohol. SIGNS AND SYMPTOMS Hypertension does not usually cause signs or symptoms. Extremely high blood pressure (hypertensive crisis) may cause headache, anxiety, shortness of breath, and nosebleed. DIAGNOSIS To check if you have hypertension, your health care provider will measure your blood pressure while you are seated, with your arm held at the level of your heart. It should be measured at least twice using the same arm. Certain conditions can cause a difference in blood pressure between your right and left arms. A blood pressure reading that is higher than normal on one occasion does not mean that you need treatment. If  it is not clear whether you have high blood pressure, you may be asked to return on a different day to have your blood pressure checked again. Or, you may be asked to monitor your blood pressure at home for 1 or more weeks. TREATMENT Treating high blood pressure includes making lifestyle changes and possibly taking medicine. Living a healthy lifestyle can help lower high blood pressure. You may need to change some of your habits. Lifestyle changes may include:  Following the DASH diet. This diet is high in fruits, vegetables, and whole grains. It is low in salt, red meat, and added sugars.  Keep your sodium intake below 2,300 mg per day.  Getting at least 30-45 minutes of aerobic exercise at least 4 times per week.  Losing weight if necessary.  Not smoking.  Limiting alcoholic beverages.  Learning ways to reduce stress. Your health care provider may prescribe medicine if lifestyle changes are not enough to get your blood pressure under control, and if one of the following is true:  You are 18-59 years of age and your systolic blood pressure is above 140.  You are 60 years of age or older, and your systolic blood pressure is above 150.  Your diastolic blood pressure is above 90.  You have diabetes, and your systolic blood pressure is over 140 or your diastolic blood pressure is over 90.  You have kidney disease and your blood pressure is above 140/90.  You have heart disease and your blood pressure is above 140/90. Your personal target blood pressure may vary depending on your medical conditions, your age, and other factors. HOME CARE INSTRUCTIONS    Have your blood pressure rechecked as directed by your health care provider.   Take medicines only as directed by your health care provider. Follow the directions carefully. Blood pressure medicines must be taken as prescribed. The medicine does not work as well when you skip doses. Skipping doses also puts you at risk for  problems.  Do not smoke.   Monitor your blood pressure at home as directed by your health care provider. SEEK MEDICAL CARE IF:   You think you are having a reaction to medicines taken.  You have recurrent headaches or feel dizzy.  You have swelling in your ankles.  You have trouble with your vision. SEEK IMMEDIATE MEDICAL CARE IF:  You develop a severe headache or confusion.  You have unusual weakness, numbness, or feel faint.  You have severe chest or abdominal pain.  You vomit repeatedly.  You have trouble breathing. MAKE SURE YOU:   Understand these instructions.  Will watch your condition.  Will get help right away if you are not doing well or get worse.   This information is not intended to replace advice given to you by your health care provider. Make sure you discuss any questions you have with your health care provider.   Document Released: 04/01/2005 Document Revised: 08/16/2014 Document Reviewed: 01/22/2013 Elsevier Interactive Patient Education 2016 [employer]  

## 2015-06-29 ENCOUNTER — Ambulatory Visit: Payer: Commercial Managed Care - HMO | Admitting: Family Medicine

## 2015-07-17 ENCOUNTER — Encounter: Payer: Self-pay | Admitting: Family Medicine

## 2015-07-17 ENCOUNTER — Ambulatory Visit (INDEPENDENT_AMBULATORY_CARE_PROVIDER_SITE_OTHER): Payer: Commercial Managed Care - HMO | Admitting: Family Medicine

## 2015-07-17 VITALS — BP 160/70 | HR 95 | Temp 98.7°F | Ht 63.5 in | Wt 124.4 lb

## 2015-07-17 DIAGNOSIS — I10 Essential (primary) hypertension: Secondary | ICD-10-CM

## 2015-07-17 NOTE — Progress Notes (Signed)
   Subjective:    Patient ID: [name] [name], female    DOB: 04/16/1940, 75 y.o.   MRN: [id number]  HPI Follow-up hypertension Refer to last note. Patient presented with blood pressure 180/110. We added amlodipine 5 mg daily to her losartan. She's not had any headaches or peripheral edema. No dizziness or chest pains. Has resumed some walking. Limiting her wine intake to about 4-6 ounces per day Home blood pressures reviewed. Definitely improved and fairly consistently between 118-150 systolic and 64-80 diastolic  Past Medical History  Diagnosis Date  . HYPERLIPIDEMIA 07/18/2008  . HYPERTENSION 07/18/2008  . OSTEOPOROSIS 07/25/2008   Past Surgical History  Procedure Laterality Date  . Lumbar disc surgery  1998    reports that she has never smoked. She does not have any smokeless tobacco history on file. Her alcohol and drug histories are not on file. family history includes Aneurysm in her father; Cancer in her paternal aunt and paternal grandmother; Hyperlipidemia in her mother and sister; Hypertension in her mother and sister; Stroke (age of onset: 6047) in her father. No Known Allergies    Review of Systems  Constitutional: Negative for fatigue.  Eyes: Negative for visual disturbance.  Respiratory: Negative for cough, chest tightness, shortness of breath and wheezing.   Cardiovascular: Negative for chest pain, palpitations and leg swelling.  Neurological: Negative for dizziness, seizures, syncope, weakness, light-headedness and headaches.       Objective:   Physical Exam  Constitutional: She appears well-developed and well-nourished.  Eyes: Pupils are equal, round, and reactive to light.  Neck: Neck supple. No JVD present. No thyromegaly present.  Cardiovascular: Normal rate and regular rhythm.  Exam reveals no gallop.   Pulmonary/Chest: Effort normal and breath sounds normal. No respiratory distress. She has no wheezes. She has no rales.  Musculoskeletal: She exhibits no edema.    Neurological: She is alert.          Assessment & Plan:  Hypertension. Improved. She probably has element of whitecoat syndrome. Blood pressures have been fairly consistently below 150 systolic and below 90 diastolic recently. Continue current regimen. Resume walking. Follow-up in 3 months to reassess

## 2015-07-17 NOTE — Progress Notes (Signed)
Pre visit review using our clinic review tool, if applicable. No additional management support is needed unless otherwise documented below in the visit note. 

## 2015-08-23 DIAGNOSIS — H5210 Myopia, unspecified eye: Secondary | ICD-10-CM | POA: Diagnosis not present

## 2015-09-20 ENCOUNTER — Ambulatory Visit: Payer: Commercial Managed Care - HMO | Admitting: Family Medicine

## 2015-10-04 ENCOUNTER — Other Ambulatory Visit: Payer: Self-pay | Admitting: Family Medicine

## 2015-10-19 ENCOUNTER — Ambulatory Visit: Payer: Commercial Managed Care - HMO | Admitting: Family Medicine

## 2015-10-20 ENCOUNTER — Ambulatory Visit (INDEPENDENT_AMBULATORY_CARE_PROVIDER_SITE_OTHER): Payer: Commercial Managed Care - HMO | Admitting: Family Medicine

## 2015-10-20 VITALS — BP 130/70 | HR 95 | Temp 98.2°F | Ht 63.5 in | Wt 122.9 lb

## 2015-10-20 DIAGNOSIS — I10 Essential (primary) hypertension: Secondary | ICD-10-CM | POA: Diagnosis not present

## 2015-10-20 NOTE — Progress Notes (Signed)
   Subjective:    Patient ID: [name] [name], female    DOB: 08/18/1940, 75 y.o.   MRN: [id number]  HPI Follow-up hypertension. Patient is back to walking and feels good overall. No headaches. No dizziness. No peripheral edema. She had poorly controlled blood pressure on losartan and we added amlodipine. Blood pressure has been consistently less than 150 systolic by home readings. No falls and no balance issues.  Past Medical History  Diagnosis Date  . HYPERLIPIDEMIA 07/18/2008  . HYPERTENSION 07/18/2008  . OSTEOPOROSIS 07/25/2008   Past Surgical History  Procedure Laterality Date  . Lumbar disc surgery  1998    reports that she has never smoked. She does not have any smokeless tobacco history on file. Her alcohol and drug histories are not on file. family history includes Aneurysm in her father; Cancer in her paternal aunt and paternal grandmother; Hyperlipidemia in her mother and sister; Hypertension in her mother and sister; Stroke (age of onset: 7747) in her father. No Known Allergies    Review of Systems  Constitutional: Negative for fatigue and unexpected weight change.  Eyes: Negative for visual disturbance.  Respiratory: Negative for cough, chest tightness, shortness of breath and wheezing.   Cardiovascular: Negative for chest pain, palpitations and leg swelling.  Neurological: Negative for dizziness, seizures, syncope, weakness, light-headedness and headaches.       Objective:   Physical Exam  Constitutional: She appears well-developed and well-nourished. No distress.  Neck: Neck supple.  Cardiovascular: Normal rate and regular rhythm.   Pulmonary/Chest: Effort normal and breath sounds normal. No respiratory distress. She has no wheezes. She has no rales.  Musculoskeletal: She exhibits no edema.  Lymphadenopathy:    She has no cervical adenopathy.          Assessment & Plan:  Hypertension. Improved. Continue current regimen. We'll plan yearly follow-up at this point  unless she has any other concerns. Continue regular walking  Kristian CoveyBruce W Rupert Azzara MD [facility]

## 2015-10-20 NOTE — Progress Notes (Signed)
Pre visit review using our clinic review tool, if applicable. No additional management support is needed unless otherwise documented below in the visit note. 

## 2015-10-23 ENCOUNTER — Encounter: Payer: Self-pay | Admitting: Family Medicine

## 2015-10-30 DIAGNOSIS — R1319 Other dysphagia: Secondary | ICD-10-CM | POA: Diagnosis not present

## 2015-10-30 DIAGNOSIS — R070 Pain in throat: Secondary | ICD-10-CM | POA: Diagnosis not present

## 2015-10-31 ENCOUNTER — Encounter: Payer: Self-pay | Admitting: Family Medicine

## 2015-10-31 ENCOUNTER — Ambulatory Visit (INDEPENDENT_AMBULATORY_CARE_PROVIDER_SITE_OTHER): Payer: Commercial Managed Care - HMO | Admitting: Family Medicine

## 2015-10-31 VITALS — BP 140/80 | HR 90 | Temp 98.3°F | Ht 63.5 in | Wt 125.5 lb

## 2015-10-31 DIAGNOSIS — R070 Pain in throat: Secondary | ICD-10-CM | POA: Diagnosis not present

## 2015-10-31 NOTE — Progress Notes (Signed)
Pre visit review using our clinic review tool, if applicable. No additional management support is needed unless otherwise documented below in the visit note. 

## 2015-10-31 NOTE — Progress Notes (Addendum)
Subjective:     Patient ID: Rayburn GoJulia Safer, female   DOB: 03/02/1941, 75 y.o.   MRN: [id number]  HPI Patient seen with left sided throat pain Onset this past Friday. Location is just left of the sternal notch region. She denies any initial onset while eating . She does have pain with swallowing which is mild to moderate. Went to urgent care yesterday and they prescribed Carafate tablets and Zantac though she denies any reflux whatsoever. No hoarseness. No fevers or chills. No postnasal drip. Advil helped. No recent nausea or vomiting. Denies any dysphagia. No appetite or weight changes.  Past Medical History  Diagnosis Date  . HYPERLIPIDEMIA 07/18/2008  . HYPERTENSION 07/18/2008  . OSTEOPOROSIS 07/25/2008   Past Surgical History  Procedure Laterality Date  . Lumbar disc surgery  1998    reports that she has never smoked. She does not have any smokeless tobacco history on file. Her alcohol and drug histories are not on file. family history includes Aneurysm in her father; Cancer in her paternal aunt and paternal grandmother; Hyperlipidemia in her mother and sister; Hypertension in her mother and sister; Stroke (age of onset: 5947) in her father. No Known Allergies   Review of Systems  Constitutional: Negative for fever, chills, appetite change and unexpected weight change.  HENT: Positive for sore throat. Negative for congestion and postnasal drip.   Respiratory: Negative for cough.        Objective:   Physical Exam  Constitutional: She appears well-developed and well-nourished.  HENT:  Right Ear: External ear normal.  Left Ear: External ear normal.  Mouth/Throat: Oropharynx is clear and moist.  Neck: Neck supple.  Cardiovascular: Normal rate and regular rhythm.   Pulmonary/Chest: Effort normal and breath sounds normal. No respiratory distress. She has no wheezes. She has no rales.  Lymphadenopathy:    She has no cervical adenopathy.       Assessment:     Throat pain. Location  is actually upper esophagus region. She does not describe any dysphagia. Etiology unclear. Doubt infectious. Doubt GERD related    Plan:     -Leave off Carafate -Focus on diet with soft foods -Stay well-hydrated -Consider over-the-counter Chloraseptic spray -Touch base if symptoms not resolving over the next few days-consider ENT referral versus GI referral if symptoms persist  Kristian CoveyBruce W Kyrillos Adams MD [facility]

## 2015-10-31 NOTE — Patient Instructions (Signed)
-  eat soft foods over the next few days -consider chloraseptic spray for throat relief -leave off the Carafate. -let me know if not better in 3-4 days.

## 2015-11-03 ENCOUNTER — Encounter: Payer: Self-pay | Admitting: Family Medicine

## 2015-11-03 ENCOUNTER — Telehealth: Payer: Self-pay | Admitting: Family Medicine

## 2015-11-03 NOTE — Telephone Encounter (Signed)
Pt not feeling any better and would like to have something called in.     Pharm:  [facility]

## 2015-11-03 NOTE — Telephone Encounter (Signed)
Pt is aware that either an ENT or GI referral will be ordered.

## 2015-11-03 NOTE — Telephone Encounter (Signed)
i don't think any antibiotics are indicated.  I will set up referral.  Let pt know and then route back to me.

## 2015-11-03 NOTE — Telephone Encounter (Signed)
Pt recently seen on 10/31/2015 for Throat Pain.  Please advise on referral and if she needs any medications called in for this. Thanks.

## 2015-11-05 ENCOUNTER — Encounter: Payer: Self-pay | Admitting: Family Medicine

## 2015-11-05 NOTE — Addendum Note (Signed)
Addended by: Kristian Covey on: 11/05/2015 02:07 PM   Modules accepted: Orders

## 2015-11-06 ENCOUNTER — Encounter: Payer: Self-pay | Admitting: Gastroenterology

## 2016-01-09 ENCOUNTER — Ambulatory Visit: Payer: Commercial Managed Care - HMO | Admitting: Gastroenterology

## 2016-03-14 ENCOUNTER — Encounter: Payer: Self-pay | Admitting: Family Medicine

## 2016-05-16 ENCOUNTER — Other Ambulatory Visit: Payer: Self-pay | Admitting: Family Medicine

## 2016-06-17 ENCOUNTER — Encounter: Payer: Self-pay | Admitting: Family Medicine

## 2016-06-17 ENCOUNTER — Ambulatory Visit (INDEPENDENT_AMBULATORY_CARE_PROVIDER_SITE_OTHER): Payer: Medicare HMO | Admitting: Family Medicine

## 2016-06-17 VITALS — BP 160/84 | HR 80 | Temp 98.2°F | Ht 63.5 in | Wt 126.9 lb

## 2016-06-17 DIAGNOSIS — Z0000 Encounter for general adult medical examination without abnormal findings: Secondary | ICD-10-CM

## 2016-06-17 DIAGNOSIS — I10 Essential (primary) hypertension: Secondary | ICD-10-CM | POA: Diagnosis not present

## 2016-06-17 DIAGNOSIS — Z0001 Encounter for general adult medical examination with abnormal findings: Secondary | ICD-10-CM | POA: Diagnosis not present

## 2016-06-17 LAB — LIPID PANEL
CHOL/HDL RATIO: 2
Cholesterol: 254 mg/dL — ABNORMAL HIGH (ref 0–200)
HDL: 106.8 mg/dL (ref >39.00)
LDL Cholesterol: 126 mg/dL — ABNORMAL HIGH (ref 0–99)
NONHDL: 147.24
TRIGLYCERIDES: 107 mg/dL (ref 0.0–149.0)
VLDL: 21.4 mg/dL (ref 0.0–40.0)

## 2016-06-17 LAB — CBC WITH DIFFERENTIAL/PLATELET
Basophils Absolute: 0 10*3/uL (ref 0.0–0.1)
Basophils Relative: 0.7 % (ref 0.0–3.0)
Eosinophils Absolute: 0.1 10*3/uL (ref 0.0–0.7)
Eosinophils Relative: 1.7 % (ref 0.0–5.0)
HEMATOCRIT: 42.9 % (ref 36.0–46.0)
Hemoglobin: 14.5 g/dL (ref 12.0–15.0)
LYMPHS PCT: 33.3 % (ref 12.0–46.0)
Lymphs Abs: 1.5 10*3/uL (ref 0.7–4.0)
MCHC: 33.7 g/dL (ref 30.0–36.0)
MCV: 95 fl (ref 78.0–100.0)
MONOS PCT: 8.5 % (ref 3.0–12.0)
Monocytes Absolute: 0.4 10*3/uL (ref 0.1–1.0)
NEUTROS ABS: 2.5 10*3/uL (ref 1.4–7.7)
Neutrophils Relative %: 55.8 % (ref 43.0–77.0)
PLATELETS: 233 10*3/uL (ref 150.0–400.0)
RBC: 4.52 Mil/uL (ref 3.87–5.11)
RDW: 14.5 % (ref 11.5–15.5)
WBC: 4.4 10*3/uL (ref 4.0–10.5)

## 2016-06-17 LAB — HEPATIC FUNCTION PANEL
ALK PHOS: 77 U/L (ref 39–117)
ALT: 16 U/L (ref 0–35)
AST: 21 U/L (ref 0–37)
Albumin: 4.6 g/dL (ref 3.5–5.2)
BILIRUBIN DIRECT: 0.1 mg/dL (ref 0.0–0.3)
Total Bilirubin: 0.7 mg/dL (ref 0.2–1.2)
Total Protein: 7 g/dL (ref 6.0–8.3)

## 2016-06-17 LAB — TSH: TSH: 2.65 u[IU]/mL (ref 0.35–4.50)

## 2016-06-17 LAB — BASIC METABOLIC PANEL
BUN: 12 mg/dL (ref 6–23)
CHLORIDE: 100 meq/L (ref 96–112)
CO2: 29 meq/L (ref 19–32)
Calcium: 10 mg/dL (ref 8.4–10.5)
Creatinine, Ser: 0.89 mg/dL (ref 0.40–1.20)
GFR: 65.24 mL/min (ref >60.00)
GLUCOSE: 120 mg/dL — AB (ref 70–99)
POTASSIUM: 4.4 meq/L (ref 3.5–5.1)
SODIUM: 140 meq/L (ref 135–145)

## 2016-06-17 MED ORDER — LOSARTAN POTASSIUM 100 MG PO TABS: 100.0000 mg | ORAL_TABLET | Freq: Every day | ORAL | 3 refills | Status: DC

## 2016-06-17 NOTE — Progress Notes (Signed)
Subjective:     Patient ID: [name] [name], female   DOB: 08-01-1940, 76 y.o.   MRN: [id number]  HPI Patient seen for wellness visit. She has declined multiple screening things in the past. She has history of reported osteoporosis and took Fosamax for several years but has declined follow-up DEXA scanning. She also has not had a mammogram now since 2014. She has declined colonoscopy in the past. Medications reviewed. She remains on amlodipine and losartan for hypertension. She states she is compliant with therapy. Not monitoring blood pressures regularly but usually getting between 140s and 150 systolic. Her immunizations are up-to-date.  Past Medical History:  Diagnosis Date  . HYPERLIPIDEMIA 07/18/2008  . HYPERTENSION 07/18/2008  . OSTEOPOROSIS 07/25/2008   Past Surgical History:  Procedure Laterality Date  . LUMBAR DISC SURGERY  1998    reports that she has never smoked. She has never used smokeless tobacco. Her alcohol and drug histories are not on file. family history includes Aneurysm in her father; Cancer in her paternal aunt and paternal grandmother; Hyperlipidemia in her mother and sister; Hypertension in her mother and sister; Stroke (age of onset: 32) in her father. No Known Allergies   Review of Systems  Constitutional: Negative for activity change, appetite change, fatigue, fever and unexpected weight change.  HENT: Negative for ear pain, hearing loss, sore throat and trouble swallowing.   Eyes: Negative for visual disturbance.  Respiratory: Negative for cough and shortness of breath.   Cardiovascular: Negative for chest pain and palpitations.  Gastrointestinal: Negative for abdominal pain, blood in stool, constipation and diarrhea.  Genitourinary: Negative for dysuria and hematuria.  Musculoskeletal: Negative for arthralgias, back pain and myalgias.  Skin: Negative for rash.  Neurological: Negative for dizziness, syncope and headaches.  Hematological: Negative for adenopathy.   Psychiatric/Behavioral: Negative for confusion and dysphoric mood.       Objective:   Physical Exam  Constitutional: She is oriented to person, place, and time. She appears well-developed and well-nourished.  HENT:  Head: Normocephalic and atraumatic.  Eyes: EOM are normal. Pupils are equal, round, and reactive to light.  Neck: Normal range of motion. Neck supple. No thyromegaly present.  Cardiovascular: Normal rate, regular rhythm and normal heart sounds.   No murmur heard. Pulmonary/Chest: Breath sounds normal. No respiratory distress. She has no wheezes. She has no rales.  Abdominal: Soft. Bowel sounds are normal. She exhibits no distension and no mass. There is no tenderness. There is no rebound and no guarding.  Musculoskeletal: Normal range of motion. She exhibits no edema.  Lymphadenopathy:    She has no cervical adenopathy.  Neurological: She is alert and oriented to person, place, and time. She displays normal reflexes. No cranial nerve deficit.  Skin: No rash noted.  Psychiatric: She has a normal mood and affect. Her behavior is normal. Judgment and thought content normal.       Assessment:     Patient here for wellness visit. She has history of hypertension and poorly controlled by today's readings both initially and at follow-up. Repeat reading left arm seated 164/88    Plan:     -Increase losartan to 100 mg daily and plan office follow-up in 3 months to reassess blood pressure -We encourage her to check several home blood pressures as well -Continue with regular weightbearing and walking activities -Continue daily calcium and vitamin D -She is encouraged to set up repeat mammography though she is uncertain at this time -We discussed other preventative things such as DEXA scan  and colon cancer screening and she declines -Obtain screening lab work  Kristian CoveyBruce W Kohl Polinsky MD [facility]

## 2016-06-17 NOTE — Patient Instructions (Addendum)
Set up repeat mammogram. Continue with regular weight bearing exercise. Continue with daily calcium 1,200 and Vit D 1,000 IU Increase the Losartan to 100 mg daily.

## 2016-06-17 NOTE — Progress Notes (Signed)
Pre visit review using our clinic review tool, if applicable. No additional management support is needed unless otherwise documented below in the visit note. 

## 2016-07-04 ENCOUNTER — Encounter: Payer: Self-pay | Admitting: Family Medicine

## 2016-08-05 ENCOUNTER — Encounter: Payer: Self-pay | Admitting: Family Medicine

## 2016-08-05 ENCOUNTER — Ambulatory Visit (INDEPENDENT_AMBULATORY_CARE_PROVIDER_SITE_OTHER): Payer: Medicare HMO | Admitting: Family Medicine

## 2016-08-05 VITALS — BP 162/90 | HR 92 | Temp 97.9°F | Wt 129.5 lb

## 2016-08-05 DIAGNOSIS — S39012A Strain of muscle, fascia and tendon of lower back, initial encounter: Secondary | ICD-10-CM | POA: Diagnosis not present

## 2016-08-05 NOTE — Progress Notes (Signed)
Subjective:     Patient ID: [name] [name], female   DOB: April 20, 1940, 76 y.o.   MRN: [id number]  HPI Patient seen for acute visit with low back pain which started about a week ago. She was helping her husband pull and lift some bags of dirt. She noticed some soreness afterwards which is right lower lumbar area. No radiculitis symptoms. No radiation of pain. She had disc herniation several years ago. She denies any lower extremity numbness or weakness. Pain has been about 5 out of 10 severity. More of a dull ache and relatively constant. She's tried ice and Advil which seemed to help. Pain is worse with position change. Denies any associated fever, chills, appetite change, or weight change. No dysuria  Past Medical History:  Diagnosis Date  . HYPERLIPIDEMIA 07/18/2008  . HYPERTENSION 07/18/2008  . OSTEOPOROSIS 07/25/2008   Past Surgical History:  Procedure Laterality Date  . LUMBAR DISC SURGERY  1998    reports that she has never smoked. She has never used smokeless tobacco. Her alcohol and drug histories are not on file. family history includes Aneurysm in her father; Cancer in her paternal aunt and paternal grandmother; Hyperlipidemia in her mother and sister; Hypertension in her mother and sister; Stroke (age of onset: 41) in her father. No Known Allergies   Review of Systems  Constitutional: Negative for appetite change, chills, fever and unexpected weight change.  Gastrointestinal: Negative for abdominal pain.  Genitourinary: Negative for dysuria.  Musculoskeletal: Positive for back pain.  Neurological: Negative for weakness and numbness.       Objective:   Physical Exam  Constitutional: She appears well-developed and well-nourished.  Cardiovascular: Normal rate and regular rhythm.   Pulmonary/Chest: Effort normal and breath sounds normal. No respiratory distress. She has no wheezes. She has no rales.  Musculoskeletal:  Straight leg raise is negative bilaterally  Neurological:   Full-strength lower extremities. Symmetric reflexes knee and ankle bilaterally. Normal sensory function to touch.       Assessment:     Right lumbar back pain. Suspect strain. Nonfocal neuro exam    Plan:     -Reviewed some extension stretches -Continue heat and/or ice for symptom relief. -Cautious use of Advil given her age and hypertension history -Continue walking and other aerobic activities as tolerated -Touch base in 2-3 weeks if not improving  Kristian Covey MD [facility]

## 2016-08-05 NOTE — Progress Notes (Signed)
Pre visit review using our clinic review tool, if applicable. No additional management support is needed unless otherwise documented below in the visit note. 

## 2016-08-05 NOTE — Patient Instructions (Signed)
Low Back Sprain A sprain is a stretch or tear in the bands of tissue that hold bones and joints together (ligaments). Sprains of the lower back (lumbar spine) are a common cause of low back pain. A sprain occurs when ligaments are overextended or stretched beyond their limits. The ligaments can become inflamed, resulting in pain and sudden muscle tightening (spasms). A sprain can be caused by an injury (trauma), or it can develop gradually due to overuse. There are three types of sprains:  Grade 1 is a mild sprain involving an overstretched ligament or a very slight tear of the ligament.  Grade 2 is a moderate sprain involving a partial tear of the ligament.  Grade 3 is a severe sprain involving a complete tear of the ligament. What are the causes? This condition may be caused by:  Trauma, such as a fall or a hit to the body.  Twisting or overstretching the back. This may result from doing activities that require a lot of energy, such as lifting heavy objects. What increases the risk? The following factors may increase your risk of getting this condition:  Playing contact sports.  Participating in sports or activities that put excessive stress on the back and require a lot of bending and twisting, including:  Lifting weights or heavy objects.  Gymnastics.  Soccer.  Figure skating.  Snowboarding.  Being overweight or obese.  Having poor strength and flexibility. What are the signs or symptoms? Symptoms of this condition may include:  Sharp or dull pain in the lower back that does not go away. Pain may extend to the buttocks.  Stiffness.  Limited range of motion.  Inability to stand up straight due to stiffness or pain.  Muscle spasms. How is this diagnosed?   This condition may be diagnosed based on:  Your symptoms.  Your medical history.  A physical exam.  Your health care provider may push on certain areas of your back to determine the source of your  pain.  You may be asked to bend forward, backward, and side to side to assess the severity of your pain and your range of motion.  Imaging tests, such as:  X-rays.  MRI. How is this treated? Treatment for this condition may include:  Applying heat and cold to the affected area.  Medicines to help relieve pain and to relax your muscles (muscle relaxants).  NSAIDs to help reduce swelling and discomfort.  Physical therapy. When your symptoms improve, it is important to gradually return to your normal routine as soon as possible to reduce pain, avoid stiffness, and avoid loss of muscle strength. Generally, symptoms should improve within 6 weeks of treatment. However, recovery time varies. Follow these instructions at home: Managing pain, stiffness, and swelling   If directed, apply ice to the injured area during the first 24 hours after your injury.  Put ice in a plastic bag.  Place a towel between your skin and the bag.  Leave the ice on for 20 minutes, 2-3 times a day.  If directed, apply heat to the affected area as often as told by your health care provider. Use the heat source that your health care provider recommends, such as a moist heat pack or a heating pad.  Place a towel between your skin and the heat source.  Leave the heat on for 20-30 minutes.  Remove the heat if your skin turns bright red. This is especially important if you are unable to feel pain, heat, or cold. You   may have a greater risk of getting burned. Activity   Rest and return to your normal activities as told by your health care provider. Ask your health care provider what activities are safe for you.  Avoid activities that take a lot of effort (are strenuous) for as long as told by your health care provider.  Do exercises as told by your health care provider. General instructions    Take over-the-counter and prescription medicines only as told by your health care provider.  If you have  questions or concerns about safety while taking pain medicine, talk with your health care provider.  Do not drive or operate heavy machinery until you know how your pain medicine affects you.  Do not use any tobacco products, such as cigarettes, chewing tobacco, and e-cigarettes. Tobacco can delay bone healing. If you need help quitting, ask your health care provider.  Keep all follow-up visits as told by your health care provider. This is important. How is this prevented?  Warm up and stretch before being active.  Cool down and stretch after being active.  Give your body time to rest between periods of activity.  Avoid:  Being physically inactive for long periods at a time.  Exercising or playing sports when you are tired or in pain.  Use correct form when playing sports and lifting heavy objects.  Use good posture when sitting and standing.  Maintain a healthy weight.  Sleep on a mattress with medium firmness to support your back.  Make sure to use equipment that fits you, including shoes that fit well.  Be safe and responsible while being active to avoid falls.  Do at least 150 minutes of moderate-intensity exercise each week, such as brisk walking or water aerobics. Try a form of exercise that takes stress off your back, such as swimming or stationary cycling.  Maintain physical fitness, including:  Strength. In particular, develop and maintain strong abdominal muscles.  Flexibility.  Cardiovascular fitness.  Endurance. Contact a health care provider if:  Your back pain does not improve after 6 weeks of treatment.  Your symptoms get worse. Get help right away if:  Your back pain is severe.  You are unable to stand or walk.  You develop pain in your legs.  You develop weakness in your buttocks or legs.  You have difficulty controlling when you urinate or when you have a bowel movement. This information is not intended to replace advice given to you by  your health care provider. Make sure you discuss any questions you have with your health care provider. Document Released: 04/01/2005 Document Revised: 12/07/2015 Document Reviewed: 01/11/2015 Elsevier Interactive Patient Education  2017 [employer]  

## 2016-08-12 ENCOUNTER — Ambulatory Visit (INDEPENDENT_AMBULATORY_CARE_PROVIDER_SITE_OTHER): Payer: Medicare HMO | Admitting: Family Medicine

## 2016-08-12 ENCOUNTER — Encounter: Payer: Self-pay | Admitting: Family Medicine

## 2016-08-12 VITALS — BP 150/74 | HR 98 | Temp 98.2°F | Wt 127.1 lb

## 2016-08-12 DIAGNOSIS — S39012D Strain of muscle, fascia and tendon of lower back, subsequent encounter: Secondary | ICD-10-CM

## 2016-08-12 NOTE — Patient Instructions (Signed)
Touch base in one week if back pain not any better.

## 2016-08-12 NOTE — Progress Notes (Signed)
Pre visit review using our clinic review tool, if applicable. No additional management support is needed unless otherwise documented below in the visit note. 

## 2016-08-12 NOTE — Progress Notes (Signed)
Subjective:     Patient ID: [name] [name], female   DOB: [birth year], 76 y.o.   MRN: [id number]  HPI Patient here for follow-up regarding low back pain. Refer to recent note  Visit 4-23 note:  "Patient seen for acute visit with low back pain which started about a week ago. She was helping her husband pull and lift some bags of dirt. She noticed some soreness afterwards which is right lower lumbar area. No radiculitis symptoms. No radiation of pain. She had disc herniation several years ago. She denies any lower extremity numbness or weakness. Pain has been about 5 out of 10 severity. More of a dull ache and relatively constant. She's tried ice and Advil which seemed to help. Pain is worse with position change. Denies any associated fever, chills, appetite change, or weight change. No dysuria"  We suspected back strain. Patient has treated conservatively with stretches, heat, ice, Advil, and topical sports cream. All provide temporary relief. Pain is really no better but not much worse. Walking without difficulty. No numbness or weakness. No urine or stool incontinence. No appetite or weight change. Back pain worse with position change and back flexion  Past Medical History:  Diagnosis Date  . HYPERLIPIDEMIA 07/18/2008  . HYPERTENSION 07/18/2008  . OSTEOPOROSIS 07/25/2008   Past Surgical History:  Procedure Laterality Date  . LUMBAR DISC SURGERY  1998    reports that she has never smoked. She has never used smokeless tobacco. Her alcohol and drug histories are not on file. family history includes Aneurysm in her father; Cancer in her paternal aunt and paternal grandmother; Hyperlipidemia in her mother and sister; Hypertension in her mother and sister; Stroke (age of onset: 77) in her father. No Known Allergies   Review of Systems  Constitutional: Negative for appetite change, chills, fever and unexpected weight change.  Gastrointestinal: Negative for abdominal pain.  Musculoskeletal: Positive for  back pain.  Neurological: Negative for weakness and numbness.       Objective:   Physical Exam  Constitutional: She appears well-developed and well-nourished.  Cardiovascular: Normal rate and regular rhythm.   Pulmonary/Chest: Effort normal and breath sounds normal. No respiratory distress. She has no wheezes. She has no rales.  Musculoskeletal: She exhibits no edema.  Straight leg raise negative  Neurological:  Full-strength lower extremities. Symmetric reflexes ankle and knee bilaterally.       Assessment:     Right lower lumbar back pain. 2 week duration. Suspect strain. Nonfocal exam neurologically.    Plan:     -Continue conservative measures. Avoid repetitive back flexion -Consider physical therapy if not improved further in one week  Kristian Covey MD [facility]

## 2016-08-31 ENCOUNTER — Encounter: Payer: Self-pay | Admitting: Family Medicine

## 2016-09-02 ENCOUNTER — Ambulatory Visit (INDEPENDENT_AMBULATORY_CARE_PROVIDER_SITE_OTHER): Payer: Medicare HMO | Admitting: Family Medicine

## 2016-09-02 ENCOUNTER — Encounter: Payer: Self-pay | Admitting: Family Medicine

## 2016-09-02 VITALS — BP 160/70 | HR 79 | Temp 98.3°F | Wt 127.6 lb

## 2016-09-02 DIAGNOSIS — M5441 Lumbago with sciatica, right side: Secondary | ICD-10-CM

## 2016-09-02 DIAGNOSIS — I10 Essential (primary) hypertension: Secondary | ICD-10-CM | POA: Diagnosis not present

## 2016-09-02 NOTE — Patient Instructions (Signed)
Let me know if not improving in 2-3 weeks with the PT.

## 2016-09-02 NOTE — Progress Notes (Signed)
Subjective:     Patient ID: [name] [name], female   DOB: 03/01/1941, 76 y.o.   MRN: [id number]  HPI Pt seen for follow up regarding low back pain.  Onset after doing some lifting.  This was a couple of weeks ago. She has pain mostly right lower lumbar region with occasional radiation down the right lower leg sometimes all the way to the ankle. No numbness or weakness. No urine or stool incontinence. Pain is 7 out of 10 at it's worst. No appetite or weight changes. No fevers or chills. Pain worse with changing positions.  She has hypertension treated with amlodipine and losartan. Compliant with medications. She has noted recent bump in her blood pressure at home which she attributes to her back pain. Not taking any regular nonsteroidals. No regular alcohol use.  Past Medical History:  Diagnosis Date  . HYPERLIPIDEMIA 07/18/2008  . HYPERTENSION 07/18/2008  . OSTEOPOROSIS 07/25/2008   Past Surgical History:  Procedure Laterality Date  . LUMBAR DISC SURGERY  1998    reports that she has never smoked. She has never used smokeless tobacco. Her alcohol and drug histories are not on file. family history includes Aneurysm in her father; Cancer in her paternal aunt and paternal grandmother; Hyperlipidemia in her mother and sister; Hypertension in her mother and sister; Stroke (age of onset: 5447) in her father. No Known Allergies   Review of Systems  Constitutional: Negative for appetite change, chills and fever.  Cardiovascular: Negative for chest pain.  Gastrointestinal: Negative for abdominal pain.  Genitourinary: Negative for dysuria.  Musculoskeletal: Positive for back pain.  Neurological: Negative for weakness and numbness.       Objective:   Physical Exam  Constitutional: She appears well-developed and well-nourished.  Cardiovascular: Normal rate and regular rhythm.   Pulmonary/Chest: Effort normal and breath sounds normal. No respiratory distress. She has no wheezes. She has no rales.   Musculoskeletal:  Negative straight leg raises. No edema lower extremities  Neurological:  Full-strength lower extremities. Symmetric reflexes ankle and knee bilaterally       Assessment:     #1 Right lower lumbar back pain with occasional sciatica type symptoms but nonfocal neuro exam  #2 hypertension suboptimally controlled and possibly exacerbated by recent low back pain    Plan:     -Set up referral to physical therapy -Avoid any heavy lifting or frequent back flexion in the meantime -Monitor blood pressure and be in touch if consistently greater than 140/90 -She has followed June 11 and reassess then. If blood pressure still up at that time consider changing losartan to losartan HCTZ  Kristian CoveyBruce W Kagan Hietpas MD [facility]

## 2016-09-05 ENCOUNTER — Ambulatory Visit: Payer: Medicare HMO | Attending: Family Medicine | Admitting: Physical Therapy

## 2016-09-05 DIAGNOSIS — M5441 Lumbago with sciatica, right side: Secondary | ICD-10-CM

## 2016-09-05 DIAGNOSIS — M6281 Muscle weakness (generalized): Secondary | ICD-10-CM | POA: Diagnosis not present

## 2016-09-05 NOTE — Therapy (Signed)
[facility] [address], [address] [place], [place], [address] Phone: [phone]   Fax:  [phone]  Physical Therapy Evaluation  Patient Details  Name: [name] GoJulia [name] MRN: [id number] Date of Birth: 09/03/1940 Referring Provider: Dr. Caryl NeverBurchette  Encounter Date: 09/05/2016      PT End of Session - 09/05/16 1829    Visit Number 1   Date for PT Re-Evaluation 10/31/16   Authorization Type Medicare G codes;  KX at visit 14   PT Start Time 1445   PT Stop Time 1530   PT Time Calculation (min) 45 min   Activity Tolerance Patient tolerated treatment well      Past Medical History:  Diagnosis Date  . HYPERLIPIDEMIA 07/18/2008  . HYPERTENSION 07/18/2008  . OSTEOPOROSIS 07/25/2008    Past Surgical History:  Procedure Laterality Date  . LUMBAR DISC SURGERY  1998    There were no vitals filed for this visit.       Subjective Assessment - 09/05/16 1452    Subjective Pain began 2 months ago while helping unload large bags of soil, pulling motion off a truck.  The pain was across low back but recently right LE numbness down thigh and anterior lower leg that is constant now.  Difficulty walking her usual 2 miles.  [facility].     Pertinent History osteoporosis; HTN;  several years ago lumbar discectomy (20 years ago)   Limitations Walking;Standing   How long can you sit comfortably? as long as I want to   How long can you walk comfortably? 1 mile   Diagnostic tests no tests    Patient Stated Goals have some answers and what exercises to do    Currently in Pain? Yes   Pain Score 2    Pain Location Back   Pain Orientation Right;Left   Pain Type Acute pain   Pain Radiating Towards right anterior lateral LE numbness/tingling constant   Pain Onset More than a month ago   Pain Frequency Intermittent   Aggravating Factors  initially turning in bed;  lifting; heavy housework;  getting  something off top shelf   Pain Relieving Factors lying on floor with legs up on the wall or couch            [facility] - 09/05/16 0001      Assessment   Medical Diagnosis LBP with right sciatica   Referring Provider Dr. Caryl NeverBurchette   Onset Date/Surgical Date --  2 months   Hand Dominance Right   Next MD Visit 09/18/16   Prior Therapy no     Precautions   Precautions Other (comment)   Precaution Comments osteoporosis     Restrictions   Weight Bearing Restrictions No     Balance Screen   Has the patient fallen in the past 6 months No   Has the patient had a decrease in activity level because of a fear of falling?  No   Is the patient reluctant to leave their home because of a fear of falling?  No     Home Environment   Living Environment Private residence   Living Arrangements Spouse/significant other   Available Help at Discharge Family   Type of Home House   Home Access Stairs to enter   Entrance Stairs-Number of Steps 2   Home Layout One level     Prior Function   Level of Independence Independent with basic  ADLs   Vocation Retired   Regulatory affairs officer, Company secretary, [employer]     Observation/Other Assessments   Focus on Therapeutic Outcomes (FOTO)  56% limitation     Posture/Postural Control   Posture/Postural Control Postural limitations   Postural Limitations Forward head;Decreased lumbar lordosis   Posture Comments slight flexed forward posture     AROM   AROM Assessment Site --  repeated movement testing is inconclusive   Lumbar Flexion --  not tested secondary to osteoporosis   Lumbar Extension 10     Strength   Strength Assessment Site Hip  decreased activation of transverse abdominals   Right/Left Hip Right;Left   Right Hip ABduction 4+/5   Left Hip ADduction 4+/5   Lumbar Flexion 3+/5   Lumbar Extension 3+/5     Flexibility   Hamstrings 80   Quadriceps 5 degrees hip extension     Slump test   Findings Negative     Prone Knee Bend  Test   Findings Negative     Straight Leg Raise   Findings Negative                           PT Education - 09/05/16 1829    Education provided Yes   Education Details self care strategies   Person(s) Educated Patient   Methods Explanation;Handout   Comprehension Verbalized understanding          PT Short Term Goals - 09/05/16 2216      PT SHORT TERM GOAL #1   Title The patient will express understanding of basic spinal precautions related to osteoporosis  10/03/16   Time 4   Period Weeks   Status New     PT SHORT TERM GOAL #2   Title The patient will report a 30% improvement in low back pain and right LE symptoms with usual ADLs   Time 4   Period Weeks   Status New     PT SHORT TERM GOAL #3   Title The patient will be able to re-initiate a walking program and be able to walk > 1 mile with minimal symptoms   Time 4   Period Weeks   Status New           PT Long Term Goals - 09/05/16 2219      PT LONG TERM GOAL #1   Title The patient will be independent in safe, self progression of HEP needed for further improvements in pain and function   10/31/16   Time 8   Period Weeks   Status New     PT LONG TERM GOAL #2   Title The patient will report a 60% improvement in back and right LE symptoms with usual ADLS   Time 8   Period Weeks   Status New     PT LONG TERM GOAL #3   Title The patient will demonstrate good body mechanics with reaching overhead and light lifting    Time 8   Period Weeks   Status New     PT LONG TERM GOAL #4   Title The patient will have grossly 4/5 abdominal, trunk extensor and hip abductor strength needed for standing, walking and household chores   Time 8   Period Weeks   Status New     PT LONG TERM GOAL #5   Title FOTO functional outcome score improved from 56% limitation to 39% indicating improved function with less pain   Time 8  Period Weeks   Status New               Plan - September 20, 2016 1830     Clinical Impression Statement Pain began 2 months ago while helping unload large bags of soil, pulling motion off a truck.  The pain was across low back but recently right LE numbness down thigh and anterior lower leg that is constant now.  She is unable to walk her usual 2 miles and [facility].   No clear directional preference with LE symptoms present with both extension and flexion of the spine.  Extensive spinal flexion and rotation of the spine not performed secondary to osteoporosis.  Decreased lumbar lordosis and excessive head forward posture.  Decreased core strength including activation of transverse abdominus, gluteal and lumbar multifidi muscles.  Right LE strength otherwise normal.  Negative SLR, slump test and prone knee bend tests.  The patient has poor body mechanics with getting in/out of bed and was instructed in log rolling.   The patient is of moderate complexity evaluation secondary to multiple co-morbidities including osteoporosis, hx of previous spinal surgery and hypertension, multiple region and body systems affected and evolving status.     Rehab Potential Good   Clinical Impairments Affecting Rehab Potential osteoporosis;  HTN    PT Frequency 2x / week   PT Duration 8 weeks   PT Treatment/Interventions ADLs/Self Care Home Management;Cryotherapy;Electrical Stimulation;Iontophoresis 4mg /ml Dexamethasone;Ultrasound;Moist Heat;Therapeutic activities;Therapeutic exercise;Neuromuscular re-education;Patient/family education;Manual techniques;Dry needling;Taping   PT Next Visit Plan neutral spine stablization/lumbopelvic-hip strengthening;  osteoporosis education/body mechanics;  modalities as needed   Consulted and Agree with Plan of Care Patient      Patient will benefit from skilled therapeutic intervention in order to improve the following deficits and impairments:  Pain, Decreased strength, Decreased range of motion,  Difficulty walking  Visit Diagnosis: Acute right-sided low back pain with right-sided sciatica - Plan: PT plan of care cert/re-cert  Muscle weakness (generalized) - Plan: PT plan of care cert/re-cert      G-Codes - 09/20/16 Sep 05, 2221    Functional Assessment Tool Used (Outpatient Only) FOTO; clinical judgement   Functional Limitation Mobility: Walking and moving around   Mobility: Walking and Moving Around Current Status ([id number]) At least 40 percent but less than 60 percent impaired, limited or restricted   Mobility: Walking and Moving Around Goal Status [phone]) At least 20 percent but less than 40 percent impaired, limited or restricted       Problem List Patient Active Problem List   Diagnosis Date Noted  . Osteoporosis 07/25/2008  . Hyperlipidemia 07/18/2008  . Essential hypertension 07/18/2008   Lavinia Sharps, PT 2016-09-20 10:26 PM Phone: [phone] Fax: [phone]  Vivien Presto 09-20-16, 10:26 PM  [facility] [address], [address] [place], [place], [address] Phone: [phone]   Fax:  [phone]  Name: Ranata Laughery MRN: [id number] Date of Birth: [birth year]

## 2016-09-05 NOTE — Therapy (Signed)
[facility] [address], [address] [place], [place], [address] Phone: [phone]   Fax:  [phone]  Physical Therapy Evaluation  Patient Details  Name: [name] GoJulia Wieneke MRN: [id number] Date of Birth: 04/28/1940 Referring Provider: Dr. Caryl NeverBurchette  Encounter Date: 09/05/2016      PT End of Session - 09/05/16 1829    Visit Number 1   Date for PT Re-Evaluation 10/31/16   Authorization Type Medicare G codes;  KX at visit 14   PT Start Time 1445   PT Stop Time 1530   PT Time Calculation (min) 45 min   Activity Tolerance Patient tolerated treatment well      Past Medical History:  Diagnosis Date  . HYPERLIPIDEMIA 07/18/2008  . HYPERTENSION 07/18/2008  . OSTEOPOROSIS 07/25/2008    Past Surgical History:  Procedure Laterality Date  . LUMBAR DISC SURGERY  1998    There were no vitals filed for this visit.       Subjective Assessment - 09/05/16 1452    Subjective Pain began 2 months ago while helping unload large bags of soil, pulling motion off a truck.  The pain was across low back but recently right LE numbness down thigh and anterior lower leg that is constant now.  Difficulty walking her usual 2 miles.  [facility].     Pertinent History osteoporosis; HTN;  several years ago lumbar discectomy (20 years ago)   Limitations Walking;Standing   How long can you sit comfortably? as long as I want to   How long can you walk comfortably? 1 mile   Diagnostic tests no tests    Patient Stated Goals have some answers and what exercises to do    Currently in Pain? Yes   Pain Score 2    Pain Location Back   Pain Orientation Right;Left   Pain Type Acute pain   Pain Radiating Towards right anterior lateral LE numbness/tingling constant   Pain Onset More than a month ago   Pain Frequency Intermittent   Aggravating Factors  initially turning in bed;  lifting; heavy housework;  getting  something off top shelf   Pain Relieving Factors lying on floor with legs up on the wall or couch            [facility] - 09/05/16 0001      Assessment   Medical Diagnosis LBP with right sciatica   Referring Provider Dr. Caryl NeverBurchette   Onset Date/Surgical Date --  2 months   Hand Dominance Right   Next MD Visit 09/18/16   Prior Therapy no     Precautions   Precautions Other (comment)   Precaution Comments osteoporosis     Restrictions   Weight Bearing Restrictions No     Balance Screen   Has the patient fallen in the past 6 months No   Has the patient had a decrease in activity level because of a fear of falling?  No   Is the patient reluctant to leave their home because of a fear of falling?  No     Home Environment   Living Environment Private residence   Living Arrangements Spouse/significant other   Available Help at Discharge Family   Type of Home House   Home Access Stairs to enter   Entrance Stairs-Number of Steps 2   Home Layout One level     Prior Function   Level of Independence Independent with basic  ADLs   Vocation Retired   Regulatory affairs officer, Company secretary, [employer]     Observation/Other Assessments   Focus on Therapeutic Outcomes (FOTO)  56% limitation     Posture/Postural Control   Posture/Postural Control Postural limitations   Postural Limitations Forward head;Decreased lumbar lordosis   Posture Comments slight flexed forward posture     AROM   AROM Assessment Site --  repeated movement testing is inconclusive   Lumbar Flexion --  not tested secondary to osteoporosis   Lumbar Extension 10     Strength   Strength Assessment Site Hip  decreased activation of transverse abdominals   Right/Left Hip Right;Left   Right Hip ABduction 4+/5   Left Hip ADduction 4+/5   Lumbar Flexion 3+/5   Lumbar Extension 3+/5     Flexibility   Hamstrings 80   Quadriceps 5 degrees hip extension     Slump test   Findings Negative     Prone Knee Bend  Test   Findings Negative     Straight Leg Raise   Findings Negative                           PT Education - 09/05/16 1829    Education provided Yes   Education Details self care strategies   Person(s) Educated Patient   Methods Explanation;Handout   Comprehension Verbalized understanding          PT Short Term Goals - 09/05/16 2216      PT SHORT TERM GOAL #1   Title The patient will express understanding of basic spinal precautions related to osteoporosis  10/03/16   Time 4   Period Weeks   Status New     PT SHORT TERM GOAL #2   Title The patient will report a 30% improvement in low back pain and right LE symptoms with usual ADLs   Time 4   Period Weeks   Status New     PT SHORT TERM GOAL #3   Title The patient will be able to re-initiate a walking program and be able to walk > 1 mile with minimal symptoms   Time 4   Period Weeks   Status New           PT Long Term Goals - 09/05/16 2219      PT LONG TERM GOAL #1   Title The patient will be independent in safe, self progression of HEP needed for further improvements in pain and function   10/31/16   Time 8   Period Weeks   Status New     PT LONG TERM GOAL #2   Title The patient will report a 60% improvement in back and right LE symptoms with usual ADLS   Time 8   Period Weeks   Status New     PT LONG TERM GOAL #3   Title The patient will demonstrate good body mechanics with reaching overhead and light lifting    Time 8   Period Weeks   Status New     PT LONG TERM GOAL #4   Title The patient will have grossly 4/5 abdominal, trunk extensor and hip abductor strength needed for standing, walking and household chores   Time 8   Period Weeks   Status New     PT LONG TERM GOAL #5   Title FOTO functional outcome score improved from 56% limitation to 39% indicating improved function with less pain   Time 8  Period Weeks   Status New               Plan - 2016/09/18 1830     Clinical Impression Statement Pain began 2 months ago while helping unload large bags of soil, pulling motion off a truck.  The pain was across low back but recently right LE numbness down thigh and anterior lower leg that is constant now.  She is unable to walk her usual 2 miles and [facility].   No clear directional preference with LE symptoms present with both extension and flexion of the spine.  Extensive spinal flexion and rotation of the spine not performed secondary to osteoporosis.  Decreased lumbar lordosis and excessive head forward posture.  Decreased core strength including activation of transverse abdominus, gluteal and lumbar multifidi muscles.  Right LE strength otherwise normal.  Negative SLR, slump test and prone knee bend tests.  The patient has poor body mechanics with getting in/out of bed and was instructed in log rolling.   The patient is of moderate complexity evaluation secondary to multiple co-morbidities including osteoporosis, hx of previous spinal surgery and hypertension, multiple region and body systems affected and evolving status.     Rehab Potential Good   Clinical Impairments Affecting Rehab Potential osteoporosis;  HTN    PT Frequency 2x / week   PT Duration 8 weeks   PT Treatment/Interventions ADLs/Self Care Home Management;Cryotherapy;Electrical Stimulation;Iontophoresis 4mg /ml Dexamethasone;Ultrasound;Moist Heat;Therapeutic activities;Therapeutic exercise;Neuromuscular re-education;Patient/family education;Manual techniques;Dry needling;Taping   PT Next Visit Plan neutral spine stablization/lumbopelvic-hip strengthening;  osteoporosis education/body mechanics;  modalities as needed      Patient will benefit from skilled therapeutic intervention in order to improve the following deficits and impairments:  Pain, Decreased strength, Decreased range of motion, Difficulty walking  Visit Diagnosis: Acute  right-sided low back pain with right-sided sciatica - Plan: PT plan of care cert/re-cert  Muscle weakness (generalized) - Plan: PT plan of care cert/re-cert      G-Codes - 18-Sep-2016 03-Sep-2221    Functional Assessment Tool Used (Outpatient Only) FOTO; clinical judgement   Functional Limitation Mobility: Walking and moving around   Mobility: Walking and Moving Around Current Status [phone]) At least 40 percent but less than 60 percent impaired, limited or restricted   Mobility: Walking and Moving Around Goal Status [phone]) At least 20 percent but less than 40 percent impaired, limited or restricted       Problem List Patient Active Problem List   Diagnosis Date Noted  . Osteoporosis 07/25/2008  . Hyperlipidemia 07/18/2008  . Essential hypertension 07/18/2008    [name] [name] Sep 18, 2016, 10:25 PM  [facility] [address], [address] [place], [place], [address] Phone: [phone]   Fax:  [phone]  Name: [name] [name] MRN: [id number] Date of Birth: 1940-04-23

## 2016-09-05 NOTE — Patient Instructions (Signed)
   Log rolling to get in/out of bed  Sit with knees slightly higher than hips or lying with legs propped every 3-4 hours for 10 minutes  Progress short distance walking  Watch posture  Avoid flexing spine and rotation    Lavinia SharpsStacy Breshae Belcher PT [facility] [address], [address] [place], [place] [address] Phone # [phone] Fax [phone]

## 2016-09-11 ENCOUNTER — Ambulatory Visit: Payer: Medicare HMO | Admitting: Physical Therapy

## 2016-09-11 ENCOUNTER — Encounter: Payer: Self-pay | Admitting: Physical Therapy

## 2016-09-11 DIAGNOSIS — M6281 Muscle weakness (generalized): Secondary | ICD-10-CM

## 2016-09-11 DIAGNOSIS — M5441 Lumbago with sciatica, right side: Secondary | ICD-10-CM

## 2016-09-11 NOTE — Patient Instructions (Signed)

## 2016-09-11 NOTE — Therapy (Signed)
[facility] [address], [address] [place], [place], [address] Phone: [phone]   Fax:  [phone]  Physical Therapy Treatment  Patient Details  Name: [name] [name] MRN: [id number] Date of Birth: 01/22/1941 Referring Provider: Dr. Caryl NeverBurchette  Encounter Date: 09/11/2016      PT End of Session - 09/11/16 1230    Visit Number 2   Date for PT Re-Evaluation 10/31/16   Authorization Type Medicare G codes;  KX at visit 14   PT Start Time 1228   PT Stop Time (P)  1329   PT Time Calculation (min) (P)  61 min   Activity Tolerance Patient tolerated treatment well      Past Medical History:  Diagnosis Date  . HYPERLIPIDEMIA 07/18/2008  . HYPERTENSION 07/18/2008  . OSTEOPOROSIS 07/25/2008    Past Surgical History:  Procedure Laterality Date  . LUMBAR DISC SURGERY  1998    There were no vitals filed for this visit.      Subjective Assessment - 09/11/16 1229    Subjective Patient reports back feeling the same.    Pertinent History osteoporosis; HTN;  several years ago lumbar discectomy (20 years ago)   Limitations Walking;Standing   How long can you sit comfortably? as long as I want to   How long can you walk comfortably? 1 mile   Diagnostic tests no tests    Patient Stated Goals have some answers and what exercises to do    Currently in Pain? Yes   Pain Score 6    Pain Location Back   Pain Orientation Right;Left   Pain Type Acute pain                         [facility] Adult PT Treatment/Exercise - 09/11/16 0001      Lumbar Exercises: Stretches   Single Knee to Chest Stretch 2 reps;10 seconds   Piriformis Stretch 2 reps;10 seconds     Lumbar Exercises: Aerobic   Stationary Bike Nustep L1 x 6 minutes  Therapist present to monitor patient     Lumbar Exercises: Supine   Ab Set 10 reps  with breathing technique   Glut Set 10 reps  With ball squeeze   Clam 10 reps  AROM external rotation for abdominal  stability   Bent Knee Raise 10 reps   Bridge --  Increased pain with hip extension     Modalities   Modalities Electrical Stimulation;Cryotherapy     Cryotherapy   Number Minutes Cryotherapy 15 Minutes   Cryotherapy Location Lumbar Spine   Type of Cryotherapy Ice pack     Electrical Stimulation   Electrical Stimulation Location Lumbar spine   Electrical Stimulation Action IFC   Electrical Stimulation Parameters 15 minutes to tolerance   Electrical Stimulation Goals Pain     Manual Therapy   Manual Therapy Soft tissue mobilization   Manual therapy comments Patient prone    Soft tissue mobilization Lumbar spine                PT Education - 09/11/16 1316    Education provided Yes   Education Details Posture and body mechanics   Person(s) Educated Patient   Methods Explanation;Handout   Comprehension Verbalized understanding          PT Short Term Goals - 09/11/16 1231      PT SHORT TERM GOAL #1   Title The patient will express understanding of basic spinal precautions related to  osteoporosis  10/03/16   Time 4   Period Weeks   Status On-going     PT SHORT TERM GOAL #2   Title The patient will report a 30% improvement in low back pain and right LE symptoms with usual ADLs   Time 4   Period Weeks   Status On-going     PT SHORT TERM GOAL #3   Title The patient will be able to re-initiate a walking program and be able to walk > 1 mile with minimal symptoms   Time 4   Period Weeks   Status On-going           PT Long Term Goals - 09/11/16 1231      PT LONG TERM GOAL #1   Title The patient will be independent in safe, self progression of HEP needed for further improvements in pain and function   10/31/16   Time 8   Period Weeks   Status On-going     PT LONG TERM GOAL #2   Title The patient will report a 60% improvement in back and right LE symptoms with usual ADLS   Time 8   Period Weeks   Status On-going     PT LONG TERM GOAL #3   Title The  patient will demonstrate good body mechanics with reaching overhead and light lifting    Time 8   Period Weeks   Status On-going     PT LONG TERM GOAL #4   Title The patient will have grossly 4/5 abdominal, trunk extensor and hip abductor strength needed for standing, walking and household chores   Time 8   Period Weeks   Status On-going     PT LONG TERM GOAL #5   Title FOTO functional outcome score improved from 56% limitation to 39% indicating improved function with less pain   Time 8   Period Weeks   Status On-going               Plan - 09/11/16 1313    Clinical Impression Statement Pateint reports pain is the same. Able to tolerate core strengthening exercises well. Increased pain in extension and decreased pain in flexion. Patient reports her husband noticed she is leaning forward while walking. Patient responded well to manual soft tissue mobilization and modalities. Discussed with patient importance of body mechanics for osteporosis. Patient will continue to benefit from skilled therapy for core strength and stability as well as management of pain symptoms.    Rehab Potential Good   Clinical Impairments Affecting Rehab Potential osteoporosis;  HTN    PT Frequency 2x / week   PT Duration 8 weeks   PT Treatment/Interventions ADLs/Self Care Home Management;Cryotherapy;Electrical Stimulation;Iontophoresis 4mg /ml Dexamethasone;Ultrasound;Moist Heat;Therapeutic activities;Therapeutic exercise;Neuromuscular re-education;Patient/family education;Manual techniques;Dry needling;Taping   PT Next Visit Plan Review body mechanics, core strength, soft tissue work to lumbar spine, modalities as needed   Consulted and Agree with Plan of Care Patient      Patient will benefit from skilled therapeutic intervention in order to improve the following deficits and impairments:  Pain, Decreased strength, Decreased range of motion, Difficulty walking  Visit Diagnosis: Acute right-sided low  back pain with right-sided sciatica  Muscle weakness (generalized)     Problem List Patient Active Problem List   Diagnosis Date Noted  . Osteoporosis 07/25/2008  . Hyperlipidemia 07/18/2008  . Essential hypertension 07/18/2008    Tyrone Apple PTA 09/11/2016, 2:02 PM  [facility] [address], [address]  [place], [place], [address] Phone: [phone]   Fax:  [phone]  Name: [name] [name] MRN: [id number] Date of Birth: 21-Mar-1941

## 2016-09-12 ENCOUNTER — Encounter: Payer: Self-pay | Admitting: Family Medicine

## 2016-09-12 DIAGNOSIS — M545 Low back pain, unspecified: Secondary | ICD-10-CM

## 2016-09-12 DIAGNOSIS — M79604 Pain in right leg: Secondary | ICD-10-CM

## 2016-09-13 ENCOUNTER — Encounter: Payer: Self-pay | Admitting: Physical Therapy

## 2016-09-13 ENCOUNTER — Ambulatory Visit: Payer: Medicare HMO | Attending: Family Medicine | Admitting: Physical Therapy

## 2016-09-13 DIAGNOSIS — M5441 Lumbago with sciatica, right side: Secondary | ICD-10-CM

## 2016-09-13 DIAGNOSIS — M6281 Muscle weakness (generalized): Secondary | ICD-10-CM | POA: Insufficient documentation

## 2016-09-13 NOTE — Therapy (Addendum)
[facility] [address] [place], [place], [address] Phone: [phone]   Fax:  [phone]  Physical Therapy Treatment/Discharge Summary  Patient Details  Name: [name] [name] MRN: [id number] Date of Birth: 1940/10/18 Referring Provider: Dr. Elease Hashimoto  Encounter Date: 09/13/2016      PT End of Session - 09/13/16 0922    Visit Number 3   Date for PT Re-Evaluation 10/31/16   Authorization Type Medicare G codes;  KX at visit 14   PT Start Time 0921   PT Stop Time 1015   PT Time Calculation (min) 54 min   Activity Tolerance Patient tolerated treatment well   Behavior During Therapy [facility] for tasks assessed/performed      Past Medical History:  Diagnosis Date  . HYPERLIPIDEMIA 07/18/2008  . HYPERTENSION 07/18/2008  . OSTEOPOROSIS 07/25/2008    Past Surgical History:  Procedure Laterality Date  . [place] SURGERY  1998    There were no vitals filed for this visit.      Subjective Assessment - 09/13/16 0928    Subjective Pain pretty high this AM.   Limitations Walking;Standing   How long can you sit comfortably? as long as I want to   How long can you walk comfortably? 1 mile   Diagnostic tests no tests    Currently in Pain? Yes   Pain Score 7    Pain Location Back   Pain Orientation Right;Left;Lower;Mid   Pain Descriptors / Indicators Aching;Sore;Tender   Aggravating Factors  constant   Pain Relieving Factors hooklying   Multiple Pain Sites No                         [facility] Adult PT Treatment/Exercise - 09/13/16 0001      Self-Care   Self-Care Posture;Other Self-Care Comments   Posture Using lumbar support when sitting   Other Self-Care Comments  Decompressive posture: not slouching and thinking about keeping her ribs away from her pelvic girdle .   Verbal & demonstration educatio.      Lumbar Exercises: Stretches   Lower Trunk Rotation --  Small ROM 10x     Lumbar Exercises:  Standing   Other Standing Lumbar Exercises Attempted weight shifting on mini tramp. Stopped due to increased RTLE pain and tingling.      Lumbar Exercises: Supine   Ab Set 10 reps;2 seconds  Ball squeeze 10x gentle, VC to relax through her back muscle   Glut Set --  Ball/TA/Glute cocontraction 8x 2 sec hold    Other Supine Lumbar Exercises On MHP Pilates breathing to relax about 1 min      Cryotherapy   Number Minutes Cryotherapy 15 Minutes   Cryotherapy Location Lumbar Spine   Type of Cryotherapy Ice pack     Electrical Stimulation   Electrical Stimulation Location Lumbar spine   Electrical Stimulation Action IFC   Electrical Stimulation Parameters LT sidelying with pillows 80-150 HZ   Electrical Stimulation Goals Pain     Manual Therapy   Manual therapy comments Intermittent long axis leg pull for pain throughout the tx                PT Education - 09/13/16 0952    Education provided Yes   Education Details Dos & don'ts of osteoporosis   Person(s) Educated Patient   Methods Explanation;Demonstration;Tactile cues;Verbal cues;Handout   Comprehension Verbalized understanding;Returned demonstration          PT Short  Term Goals - 09/11/16 1231      PT SHORT TERM GOAL #1   Title The patient will express understanding of basic spinal precautions related to osteoporosis  10/03/16   Time 4   Period Weeks   Status On-going     PT SHORT TERM GOAL #2   Title The patient will report a 30% improvement in low back pain and right LE symptoms with usual ADLs   Time 4   Period Weeks   Status On-going     PT SHORT TERM GOAL #3   Title The patient will be able to re-initiate a walking program and be able to walk > 1 mile with minimal symptoms   Time 4   Period Weeks   Status On-going           PT Long Term Goals - 09/11/16 1231      PT LONG TERM GOAL #1   Title The patient will be independent in safe, self progression of HEP needed for further improvements in  pain and function   10/31/16   Time 8   Period Weeks   Status On-going     PT LONG TERM GOAL #2   Title The patient will report a 60% improvement in back and right LE symptoms with usual ADLS   Time 8   Period Weeks   Status On-going     PT LONG TERM GOAL #3   Title The patient will demonstrate good body mechanics with reaching overhead and light lifting    Time 8   Period Weeks   Status On-going     PT LONG TERM GOAL #4   Title The patient will have grossly 4/5 abdominal, trunk extensor and hip abductor strength needed for standing, walking and household chores   Time 8   Period Weeks   Status On-going     PT LONG TERM GOAL #5   Title FOTO functional outcome score improved from 56% limitation to 39% indicating improved function with less pain   Time 8   Period Weeks   Status On-going               Plan - 09/13/16 0941    Clinical Impression Statement Posture remains with kyphotic posture, which she reports today has only been since her back pain came on. Pt was educated in proper posture and how to support her spine better when sitting.  She required verbal cuing to make her efforts with muscular contractions less tense/softer. This method helped her facilitate her core better and have less pain. The RTLE had some rather significant pain and tingling in standing, pt put less weight into RTLE  due to pain.     Rehab Potential Good   Clinical Impairments Affecting Rehab Potential osteoporosis;  HTN    PT Frequency 2x / week   PT Duration 8 weeks   Consulted and Agree with Plan of Care Patient      Patient will benefit from skilled therapeutic intervention in order to improve the following deficits and impairments:  Pain, Decreased strength, Decreased range of motion, Difficulty walking  Visit Diagnosis: Acute right-sided low back pain with right-sided sciatica  Muscle weakness (generalized)  PHYSICAL THERAPY DISCHARGE SUMMARY  Visits from Start of Care:  3  Current functional level related to goals / functional outcomes: The patient's husband called to request discharge from PT on patient's behalf stating she was getting an MRI soon.    Remaining deficits: As above.  No  progress toward goals   Education / Equipment: Initial HEP  Plan: Patient agrees to discharge.  Patient goals were not met. Patient is being discharged due to the patient's request.  ?????    G code:  Mobility moving around Goal CJ, Discharge CK      Problem List Patient Active Problem List   Diagnosis Date Noted  . Osteoporosis 07/25/2008  . Hyperlipidemia 07/18/2008  . Essential hypertension 07/18/2008   Ruben Im, PT 09/27/16 7:41 AM Phone: [phone] Fax: [phone]  Shrihan Putt, PTA 09/13/2016, 10:15 AM  [facility] [address], [place], [place], [address] Phone: [phone]   Fax:  [phone]  Name: [name] [name] MRN: [id number] Date of Birth: Jan 05, 1941

## 2016-09-13 NOTE — Patient Instructions (Signed)
DO's and DON'T's   Avoid and/or Minimize positions of forward bending ( flexion)  Side bending and rotation of the trunk  Especially when movements occur together   When your back aches:   Don't sit down   Lie down on your back with a small pillow under your head and one under your knees or as outlined by our therapist. Or, lie in the 90/90 position ( on the floor with your feet and legs on the sofa with knees and hips bent to 90 degrees)  Tying or putting on your shoes:   Don't bend over to tie your shoes or put on socks.  Instead, bring one foot up, cross it over the opposite knee and bend forward (hinge) at the hips to so the task.  Keep your back straight.  If you cannot do this safely, then you need to use long handled assistive devices such as a shoehorn and sock puller.  Exercising:  Don't engage in ballistic types of exercise routines such as high-impact aerobics or jumping rope  Don't do exercises in the gym that bring you forward (abdominal crunches, sit-ups, touching your  toes, knee-to-chest, straight leg raising.)  Follow a regular exercise program that includes a variety of different weight-bearing activities, such as low-impact aerobics, T' ai chi or walking as your physical therapist advises  Do exercises that emphasize return to normal body alignment and strengthening of the muscles that keep your back straight, as outlined in this program or by your therapist  Household tasks:  Don't reach unnecessarily or twist your trunk when mopping, sweeping, vacuuming, raking, making beds, weeding gardens, getting objects ou of cupboards, etc.  Keep your broom, mop, vacuum, or rake close to you and mover your whole body as you move them. Walk over to the area on which you are working. Arrange kitchen, bathroom, and bedroom shelves so that frequently used items may be reached without excessive bending, twisting, and reaching.  Use a  sturdy stool if necessary.  Don't bend from the waist to pick up something up  Off the floor, out of the trunk of your car, or to brush your teeth, wash your face, etc.   Bend at the knees, keeping back straight as possible. Use a reacher if necessary.   Prevention of fracture is the so-called "BOTTOm -Line" in the management of OSTEOPOROSIS. Do not take unnecessary chances in movement. Once a compression fracture occurs, the process is very difficult to control; one fracture is frequently followed by many more.   

## 2016-09-17 ENCOUNTER — Ambulatory Visit: Payer: Medicare HMO

## 2016-09-17 ENCOUNTER — Ambulatory Visit: Payer: Medicare HMO | Admitting: Family Medicine

## 2016-09-18 ENCOUNTER — Encounter: Payer: Medicare HMO | Admitting: Physical Therapy

## 2016-09-23 ENCOUNTER — Encounter: Payer: Self-pay | Admitting: Family Medicine

## 2016-09-23 ENCOUNTER — Ambulatory Visit (INDEPENDENT_AMBULATORY_CARE_PROVIDER_SITE_OTHER): Payer: Medicare HMO | Admitting: Family Medicine

## 2016-09-23 VITALS — BP 162/90 | HR 100 | Temp 98.3°F | Wt 128.0 lb

## 2016-09-23 DIAGNOSIS — M5416 Radiculopathy, lumbar region: Secondary | ICD-10-CM | POA: Diagnosis not present

## 2016-09-23 DIAGNOSIS — I10 Essential (primary) hypertension: Secondary | ICD-10-CM | POA: Diagnosis not present

## 2016-09-23 MED ORDER — HYDROCODONE-ACETAMINOPHEN 5-325 MG PO TABS: 1.0000 | ORAL_TABLET | Freq: Four times a day (QID) | ORAL | 0 refills | Status: DC | PRN

## 2016-09-23 MED ORDER — LOSARTAN POTASSIUM-HCTZ 100-12.5 MG PO TABS: 1.0000 | ORAL_TABLET | Freq: Every day | ORAL | 5 refills | Status: DC

## 2016-09-23 NOTE — Progress Notes (Signed)
Subjective:     Patient ID: [name] [name], female   DOB: 11/19/1940, 76 y.o.   MRN: [id number]  HPI Patient seen for follow-up regarding back pain and hypertension.  Hypertension. Currently treated with amlodipine and losartan. Blood pressure started climbing several months ago. Recently consistently around 160 systolic. We increased her losartan dosage few months ago and this has not made much change. She takes amlodipine consistently. No peripheral edema. Blood pressures around 160s systolic and 80 diastolic at home. No headaches. Is taking some nonsteroidals with Advil for her back pain. No alcohol use.  Progressive low back pain with right lower extremity radiculitis symptoms. She rates her pain 8 out of 10 at times her husband states that she seems to be in considerable discomfort intermittently. She has not responded with any medications nor physical therapy. MRI has been ordered but not scheduled yet. No urine or stool incontinence. Denies lower extremity weakness or numbness. Pain radiates from the right lower lumbar area into the buttock and down to the lower leg and ankle at times.  Past Medical History:  Diagnosis Date  . HYPERLIPIDEMIA 07/18/2008  . HYPERTENSION 07/18/2008  . OSTEOPOROSIS 07/25/2008   Past Surgical History:  Procedure Laterality Date  . LUMBAR DISC SURGERY  1998    reports that she has never smoked. She has never used smokeless tobacco. Her alcohol and drug histories are not on file. family history includes Aneurysm in her father; Cancer in her paternal aunt and paternal grandmother; Hyperlipidemia in her mother and sister; Hypertension in her mother and sister; Stroke (age of onset: 5447) in her father. No Known Allergies   Review of Systems  Constitutional: Negative for fatigue.  Eyes: Negative for visual disturbance.  Respiratory: Negative for cough, chest tightness, shortness of breath and wheezing.   Cardiovascular: Negative for chest pain, palpitations and leg  swelling.  Musculoskeletal: Positive for back pain.  Skin: Negative for rash.  Neurological: Negative for dizziness, seizures, syncope, weakness, light-headedness and headaches.       Objective:   Physical Exam  Constitutional: She appears well-developed and well-nourished.  Eyes: Pupils are equal, round, and reactive to light.  Neck: Neck supple. No JVD present. No thyromegaly present.  Cardiovascular: Normal rate and regular rhythm.  Exam reveals no gallop.   Pulmonary/Chest: Effort normal and breath sounds normal. No respiratory distress. She has no wheezes. She has no rales.  Musculoskeletal: She exhibits no edema.  Straight leg raise are negative bilaterally.  Neurological: She is alert.  Full-strength lower extremities. Symmetric reflexes.       Assessment:     #1 hypertension poorly controlled with isolated systolic hypertension  #2 progressive right lumbar back pain with right radiculopathy symptoms    Plan:     -MRI lumbar spine pending as above -Change losartan to losartan HCTZ 100/12.5 mg 1 daily and continue amlodipine -Reassess in one month and recheck basic metabolic panel in -Continue regular home assessments of blood pressure  Kristian CoveyBruce W Taleeya Blondin MD [facility]

## 2016-09-24 ENCOUNTER — Encounter: Payer: Medicare HMO | Admitting: Physical Therapy

## 2016-09-26 ENCOUNTER — Encounter: Payer: Medicare HMO | Admitting: Physical Therapy

## 2016-09-29 ENCOUNTER — Ambulatory Visit
Admission: RE | Admit: 2016-09-29 | Discharge: 2016-09-29 | Disposition: A | Discharge status: Home / Self Care | Payer: Medicare HMO | Source: Ambulatory Visit | Attending: Family Medicine | Admitting: Family Medicine

## 2016-09-29 DIAGNOSIS — M545 Low back pain, unspecified: Secondary | ICD-10-CM

## 2016-09-29 DIAGNOSIS — M79604 Pain in right leg: Secondary | ICD-10-CM

## 2016-09-29 DIAGNOSIS — M48061 Spinal stenosis, lumbar region without neurogenic claudication: Secondary | ICD-10-CM | POA: Diagnosis not present

## 2016-10-01 ENCOUNTER — Encounter: Payer: Medicare HMO | Admitting: Physical Therapy

## 2016-10-01 ENCOUNTER — Other Ambulatory Visit: Payer: Self-pay | Admitting: Family Medicine

## 2016-10-01 DIAGNOSIS — M546 Pain in thoracic spine: Secondary | ICD-10-CM

## 2016-10-03 ENCOUNTER — Encounter: Payer: Medicare HMO | Admitting: Physical Therapy

## 2016-10-04 ENCOUNTER — Encounter: Payer: Self-pay | Admitting: Family Medicine

## 2016-10-07 ENCOUNTER — Encounter: Payer: Self-pay | Admitting: Family Medicine

## 2016-10-07 DIAGNOSIS — M545 Low back pain: Secondary | ICD-10-CM | POA: Diagnosis not present

## 2016-10-09 ENCOUNTER — Encounter: Payer: Self-pay | Admitting: Physical Therapy

## 2016-10-09 ENCOUNTER — Encounter: Payer: Self-pay | Admitting: Family Medicine

## 2016-10-09 ENCOUNTER — Ambulatory Visit: Payer: Medicare HMO | Attending: Physician Assistant | Admitting: Physical Therapy

## 2016-10-09 ENCOUNTER — Other Ambulatory Visit: Payer: Self-pay | Admitting: Family Medicine

## 2016-10-09 DIAGNOSIS — R293 Abnormal posture: Secondary | ICD-10-CM | POA: Insufficient documentation

## 2016-10-09 DIAGNOSIS — M6281 Muscle weakness (generalized): Secondary | ICD-10-CM | POA: Diagnosis not present

## 2016-10-09 DIAGNOSIS — M545 Low back pain: Secondary | ICD-10-CM | POA: Diagnosis not present

## 2016-10-09 NOTE — Therapy (Addendum)
[facility] [address], [address] [place], [place], [address] Phone: [phone]   Fax:  [phone]  Physical Therapy Evaluation  Patient Details  Name: [name] [name] MRN: [id number] Date of Birth: 06/30/1940 Referring Provider: Vincent Costella PAC  Encounter Date: 10/09/2016      PT End of Session - 10/09/16 1003    Visit Number 1   Number of Visits 10   Date for PT Re-Evaluation 12/04/16   Authorization Type Medicare G codes;  KX at visit 13   PT Start Time 0930   PT Stop Time 1000   PT Time Calculation (min) 30 min   Activity Tolerance Patient tolerated treatment well   Behavior During Therapy WFL for tasks assessed/performed      Past Medical History:  Diagnosis Date  . HYPERLIPIDEMIA 07/18/2008  . HYPERTENSION 07/18/2008  . OSTEOPOROSIS 07/25/2008    Past Surgical History:  Procedure Laterality Date  . LUMBAR DISC SURGERY  1998    There were no vitals filed for this visit.       Subjective Assessment - 10/09/16 0936    Subjective Patient reports she re-injured her back 3 months ago when pulling bales of hay off the back of truck.     Pertinent History osteoporosis; HTN;  several years ago lumbar discectomy (20 years ago)   Limitations Walking;Standing   How long can you sit comfortably? none   Patient Stated Goals reduce pain   Currently in Pain? Yes   Pain Score 7    Pain Location Back   Pain Orientation Lower   Pain Type Acute pain   Pain Radiating Towards can tingle down outside of right leg when she arches her back   Pain Onset More than a month ago   Pain Frequency Constant   Aggravating Factors  arching her back   Pain Relieving Factors not arching her back, lay on floor with feet on couch   Multiple Pain Sites No            [facility] PT Assessment - 10/09/16 0001      Assessment   Medical Diagnosis M54.5 Acute right-sided low back pain without sciatica   Referring Provider Vincent Costella  PAC   Onset Date/Surgical Date 06/13/16   Next MD Visit     Prior Therapy yes     Precautions   Precautions Other (comment)   Precaution Comments osteoporosis     Restrictions   Weight Bearing Restrictions No     Balance Screen   Has the patient fallen in the past 6 months No   Has the patient had a decrease in activity level because of a fear of falling?  No   Is the patient reluctant to leave their home because of a fear of falling?  No     Home Environment   Living Environment Private residence   Living Arrangements Spouse/significant other   Available Help at Discharge Family   Type of Home House   Home Access Stairs to enter   Entrance Stairs-Number of Steps 2   Home Layout One level     Prior Function   Level of Independence Independent with basic ADLs   Vocation Retired   Leisure quilting, weaving, spinning     Cognition   Overall Cognitive Status Within Functional Limits for tasks assessed     Observation/Other Assessments   Focus on Therapeutic Outcomes (FOTO)  43% limitation  goal is 32% limitation     Posture/Postural Control     Posture/Postural Control Postural limitations   Postural Limitations Flexed trunk     AROM   Lumbar Flexion --  not tested secondary to osteoporosis   Lumbar Extension only goes to neutral   Lumbar - Right Side Bend decreased by 50%   Lumbar - Left Side Bend decreased by 50%     Strength   Right Hip ABduction 4+/5   Left Hip ABduction 4+/5   Lumbar Extension 3+/5     Palpation   SI assessment  pelvis in correct alignment     Slump test   Findings Negative     Straight Leg Raise   Findings Negative     Transfers   Transfers Not assessed            Objective measurements completed on examination: See above findings.          [facility] Adult PT Treatment/Exercise - 10/09/16 0001      Exercises   Exercises Other Exercises   Other Exercises  standing lateral glide with right side against the wall till no  fuzzy feeling in her right leg and increased pain in lumbar                PT Education - 10/09/16 1002    Education provided Yes   Education Details body mechanics with laying on side, sitting, lifting   Person(s) Educated Patient   Methods Explanation;Demonstration;Verbal cues;Handout   Comprehension Verbalized understanding;Returned demonstration          PT Short Term Goals - 10/09/16 0950      PT SHORT TERM GOAL #1   Title The patient will express understanding of basic spinal precautions related to osteoporosis    Time 4   Period Weeks   Status New     PT SHORT TERM GOAL #2   Title The patient will report a 30% improvement in low back pain and right LE symptoms with usual ADLs   Time 4   Period Weeks   Status New     PT SHORT TERM GOAL #3   Title The patient will be able to re-initiate a walking program and be able to walk > 1 mile with minimal symptoms   Time 4   Period Weeks   Status New           PT Long Term Goals - 10/09/16 0949      PT LONG TERM GOAL #1   Title The patient will be independent in safe, self progression of HEP needed for further improvements in pain and function      Time 8   Period Weeks   Status New     PT LONG TERM GOAL #2   Title The patient will report a 60% improvement in back and right LE symptoms with usual ADLS   Time 8   Period Weeks   Status Achieved     PT LONG TERM GOAL #3   Title The patient will demonstrate good body mechanics with reaching overhead and light lifting    Time 8   Period Weeks   Status New     PT LONG TERM GOAL #4   Title The patient will have grossly 4/5 abdominal, trunk extensor and hip abductor strength needed for standing, walking and household chores   Time 8   Period Weeks   Status New     PT LONG TERM GOAL #5   Title FOTO functional outcome score improved from 43% limitation to 32% indicating improved   function with less pain   Time 8   Period Weeks   Status New                 Plan - November 07, 2016 1004    Clinical Impression Statement Patient reports she has injured her back when she pulled bails of hay off the truck 3 months ago.  Patient had physical therapy 1 times but was not able to continue due to high co-pay.  Patient reports her back pain is constant at 7/10 and is worse with lumbar extension when she gets fuzzy feeling down right leg.    History and Personal Factors relevant to plan of care: osteoporosis, s/p lumbar disectomy   Clinical Presentation Stable   Clinical Presentation due to: stable condition   Clinical Decision Making Low   Rehab Potential Good   Clinical Impairments Affecting Rehab Potential osteoporosis;  HTN    PT Frequency 1x / week   PT Duration 8 weeks   PT Treatment/Interventions Moist Heat;Ultrasound;Patient/family education;Neuromuscular re-education;Therapeutic exercise;Therapeutic activities;Manual techniques;Passive range of motion   PT Next Visit Plan patient is coming 1 time per week to have her HEP updated; mckenzie progression; see if patient is ready for extension; neural tension stretch for right LE; core stabilization   PT Home Exercise Plan progress every visit   Consulted and Agree with Plan of Care Patient      Patient will benefit from skilled therapeutic intervention in order to improve the following deficits and impairments:  Pain, Decreased strength, Decreased mobility, Improper body mechanics, Decreased activity tolerance, Decreased endurance, Decreased coordination, Decreased range of motion, Increased fascial restricitons  Visit Diagnosis: Acute right-sided low back pain, with sciatica presence unspecified  Muscle weakness (generalized)  Abnormal posture      G-Codes - November 07, 2016 1008    Functional Assessment Tool Used (Outpatient Only) FOTO score is 43% limitation   Other PT Primary Current Status (A1287) At least 40 percent but less than 60 percent impaired, limited or restricted   Other  PT Primary Goal Status (O6767) At least 20 percent but less than 40 percent impaired, limited or restricted     Other Discharge status is CK.  Earlie Counts, PT 10/14/16 12:19 PM    Problem List Patient Active Problem List   Diagnosis Date Noted  . Osteoporosis 07/25/2008  . Hyperlipidemia 07/18/2008  . Essential hypertension 07/18/2008    Earlie Counts, PT Nov 07, 2016 10:11 AM   [facility] [address], [place], [place], [address] Phone: [phone]   Fax:  [phone]  Name: [name] [name] MRN: [id number] Date of Birth: [birth year] PHYSICAL THERAPY DISCHARGE SUMMARY  Visits from Start of Care: 1  Current functional level related to goals / functional outcomes: See above. Patient called to cancel all of her appointments on 10/14/2016 due to MD wanting her to not have anymore therapy.    Remaining deficits: See above.   Education / Equipment: None Plan: Patient agrees to discharge.  Patient goals were not met. Patient is being discharged due to the physician's request.  Thank you for the referral. Earlie Counts, PT 10/14/16 12:21 PM  ?????

## 2016-10-09 NOTE — Patient Instructions (Addendum)
   SGIS Right(For Right sided pain/shift correction). Stand with pain-free side about 12" away from wall with feet together and elbow bent and forearm against wall. Place opposite arm on pelvis and push toward wall and hold 2 seconds, return to starting position. Each repetition try to go further towards wall. (Note if hip is hitting wall step further way from wall). In case of lateral shift follow with repeated extension in standing.  Do this every 2 hours.  Hold till pain in back and leg fuzzy feeling leaves.   Posture - Sitting    Sit upright, head facing forward. Try using a roll to support lower back. Keep shoulders relaxed, and avoid rounded back. Keep hips level with knees. Avoid crossing legs for long periods. No sitting more than 30 minutes.  Then get up and walk around.  Copyright  VHI. All rights reserved.    Sleeping on Side    Place pillow between knees and feet. Use cervical support under neck and a roll around waist as needed.   Copyright  VHI. All rights reserved.    Lifting Principles  .Maintain proper posture and head alignment. .Slide object as close as possible before lifting. .Move obstacles out of the way. .Test before lifting; ask for help if too heavy. .Tighten stomach muscles without holding breath. .Use smooth movements; do not jerk. .Use legs to do the work, and pivot with feet. .Distribute the work load symmetrically and close to the center of trunk. .Push instead of pull whenever possible.  Copyright  VHI. All rights reserved.    Deep Squat  o  Squat and lift with both arms held against upper trunk. Tighten stomach muscles without holding breath. Use smooth movements to avoid jerking.  Copyright  VHI. All rights reserved.   avoid overhead lifting.   Log Roll    Lying on back, bend left knee and place left arm across chest. Roll all in one movement to the right. Reverse to roll to the left. Always move as one unit.   Copyright   VHI. All rights reserved.  Getting Into / Out of Bed    Lower self to lie down on one side by raising legs and lowering head at the same time. Use arms to assist moving without twisting. Bend both knees to roll onto back if desired. To sit up, start from lying on side, and use same move-ments in reverse. Keep trunk aligned with legs.   Copyright  VHI. All rights reserved.  NO BENDING AT YOUR WAIST.  KEEP TRUNK STRAIGHT.   [facility] [address], [address] [place], [place] [address] Phone # [phone] Fax [phone]

## 2016-10-14 ENCOUNTER — Ambulatory Visit: Payer: Medicare HMO | Admitting: Physical Therapy

## 2016-10-14 DIAGNOSIS — M545 Low back pain: Secondary | ICD-10-CM | POA: Diagnosis not present

## 2016-10-14 DIAGNOSIS — I10 Essential (primary) hypertension: Secondary | ICD-10-CM | POA: Diagnosis not present

## 2016-10-21 ENCOUNTER — Encounter: Payer: Medicare HMO | Admitting: Rehabilitative and Restorative Service Providers"

## 2016-10-25 ENCOUNTER — Other Ambulatory Visit: Payer: Self-pay | Admitting: Neurosurgery

## 2016-10-25 DIAGNOSIS — M545 Low back pain, unspecified: Secondary | ICD-10-CM

## 2016-10-28 ENCOUNTER — Ambulatory Visit (INDEPENDENT_AMBULATORY_CARE_PROVIDER_SITE_OTHER): Payer: Medicare HMO | Admitting: Family Medicine

## 2016-10-28 ENCOUNTER — Encounter: Payer: Self-pay | Admitting: Family Medicine

## 2016-10-28 VITALS — BP 152/78 | HR 97 | Temp 97.9°F | Ht 63.5 in | Wt 125.0 lb

## 2016-10-28 DIAGNOSIS — I10 Essential (primary) hypertension: Secondary | ICD-10-CM | POA: Diagnosis not present

## 2016-10-28 LAB — BASIC METABOLIC PANEL
BUN: 13 mg/dL (ref 6–23)
CHLORIDE: 95 meq/L — AB (ref 96–112)
CO2: 30 mEq/L (ref 19–32)
CREATININE: 0.94 mg/dL (ref 0.40–1.20)
Calcium: 10.6 mg/dL — ABNORMAL HIGH (ref 8.4–10.5)
GFR: 61.19 mL/min (ref >60.00)
GLUCOSE: 117 mg/dL — AB (ref 70–99)
POTASSIUM: 4.3 meq/L (ref 3.5–5.1)
Sodium: 136 mEq/L (ref 135–145)

## 2016-10-28 NOTE — Patient Instructions (Addendum)
Monitor blood pressure and be in touch if consistently > 140/90.   

## 2016-10-28 NOTE — Progress Notes (Signed)
Subjective:     Patient ID: [name] [name], female   DOB: 03/02/1941, 76 y.o.   MRN: [id number]  HPI Patient seen for follow-up hypertension. She's been dealing with several month history of severe back pain with severe lumbar stenosis L2-L3 and L4-L5. She is scheduled for epidural injection next week. Pain not improved with physical therapy.  Recent hypertension out of control. We changed her from losartan to losartan HCTZ one month ago. She also remains on amlodipine and compliant with therapy. They bring in log of home readings. These been consistently below 140s systolic with exception of one reading and consistently below 80 diastolic. She's had readings as low as 100/47. No dizziness. No chest pains. No peripheral edema.  Past Medical History:  Diagnosis Date  . HYPERLIPIDEMIA 07/18/2008  . HYPERTENSION 07/18/2008  . OSTEOPOROSIS 07/25/2008   Past Surgical History:  Procedure Laterality Date  . LUMBAR DISC SURGERY  1998    reports that she has never smoked. She has never used smokeless tobacco. Her alcohol and drug histories are not on file. family history includes Aneurysm in her father; Cancer in her paternal aunt and paternal grandmother; Hyperlipidemia in her mother and sister; Hypertension in her mother and sister; Stroke (age of onset: 6947) in her father. No Known Allergies   Review of Systems  Constitutional: Negative for fatigue and unexpected weight change.  Eyes: Negative for visual disturbance.  Respiratory: Negative for cough, chest tightness, shortness of breath and wheezing.   Cardiovascular: Negative for chest pain, palpitations and leg swelling.  Endocrine: Negative for polydipsia and polyuria.  Neurological: Negative for dizziness, seizures, syncope, weakness, light-headedness and headaches.       Objective:   Physical Exam  Constitutional: She appears well-developed and well-nourished.  Eyes: Pupils are equal, round, and reactive to light.  Neck: Neck supple. No JVD  present. No thyromegaly present.  Cardiovascular: Normal rate and regular rhythm.  Exam reveals no gallop.   Pulmonary/Chest: Effort normal and breath sounds normal. No respiratory distress. She has no wheezes. She has no rales.  Musculoskeletal: She exhibits no edema.  Neurological: She is alert.       Assessment:     Hypertension improved by multiple home readings. Suspected white coat syndrome    Plan:     -Check basic metabolic panel with recent initiation of HCTZ -Continue to monitor home blood pressures and be in touch if BP > 140 systolic consistently  Kristian CoveyBruce W Jenese Mischke MD [facility]

## 2016-10-29 ENCOUNTER — Encounter: Payer: Medicare HMO | Admitting: Rehabilitative and Restorative Service Providers"

## 2016-11-04 ENCOUNTER — Other Ambulatory Visit: Payer: Self-pay | Admitting: Neurosurgery

## 2016-11-04 ENCOUNTER — Ambulatory Visit
Admission: RE | Admit: 2016-11-04 | Discharge: 2016-11-04 | Disposition: A | Discharge status: Home / Self Care | Payer: Medicare HMO | Source: Ambulatory Visit | Attending: Neurosurgery | Admitting: Neurosurgery

## 2016-11-04 ENCOUNTER — Encounter: Payer: Medicare HMO | Admitting: Physical Therapy

## 2016-11-04 DIAGNOSIS — M545 Low back pain, unspecified: Secondary | ICD-10-CM

## 2016-11-04 DIAGNOSIS — M4696 Unspecified inflammatory spondylopathy, lumbar region: Secondary | ICD-10-CM | POA: Diagnosis not present

## 2016-11-04 DIAGNOSIS — M47817 Spondylosis without myelopathy or radiculopathy, lumbosacral region: Secondary | ICD-10-CM | POA: Diagnosis not present

## 2016-11-04 MED ORDER — IOPAMIDOL (ISOVUE-M 200) INJECTION 41%
1.0000 mL | Freq: Once | INTRAMUSCULAR | Status: AC
  Administered 2016-11-04: 1 mL via EPIDURAL

## 2016-11-04 MED ORDER — METHYLPREDNISOLONE ACETATE 40 MG/ML INJ SUSP (RADIOLOG
120.0000 mg | Freq: Once | INTRAMUSCULAR | Status: AC
  Administered 2016-11-04: 120 mg via EPIDURAL

## 2016-11-04 NOTE — Discharge Instructions (Signed)

## 2016-11-11 DIAGNOSIS — M48062 Spinal stenosis, lumbar region with neurogenic claudication: Secondary | ICD-10-CM | POA: Diagnosis not present

## 2016-11-11 DIAGNOSIS — I10 Essential (primary) hypertension: Secondary | ICD-10-CM | POA: Diagnosis not present

## 2016-11-11 DIAGNOSIS — M4316 Spondylolisthesis, lumbar region: Secondary | ICD-10-CM | POA: Diagnosis not present

## 2016-11-12 ENCOUNTER — Other Ambulatory Visit: Payer: Self-pay | Admitting: Neurosurgery

## 2016-11-13 ENCOUNTER — Encounter: Payer: Medicare HMO | Admitting: Physical Therapy

## 2016-11-27 DIAGNOSIS — I10 Essential (primary) hypertension: Secondary | ICD-10-CM | POA: Diagnosis not present

## 2016-11-27 DIAGNOSIS — M48062 Spinal stenosis, lumbar region with neurogenic claudication: Secondary | ICD-10-CM | POA: Diagnosis not present

## 2016-11-27 DIAGNOSIS — M4316 Spondylolisthesis, lumbar region: Secondary | ICD-10-CM | POA: Diagnosis not present

## 2016-12-02 ENCOUNTER — Other Ambulatory Visit: Payer: Self-pay | Admitting: Neurosurgery

## 2016-12-02 DIAGNOSIS — M4316 Spondylolisthesis, lumbar region: Secondary | ICD-10-CM

## 2016-12-04 ENCOUNTER — Other Ambulatory Visit: Payer: Self-pay | Admitting: Neurosurgery

## 2016-12-04 DIAGNOSIS — M4316 Spondylolisthesis, lumbar region: Secondary | ICD-10-CM

## 2016-12-10 ENCOUNTER — Ambulatory Visit
Admission: RE | Admit: 2016-12-10 | Discharge: 2016-12-10 | Disposition: A | Discharge status: Home / Self Care | Payer: Medicare HMO | Source: Ambulatory Visit | Attending: Neurosurgery | Admitting: Neurosurgery

## 2016-12-10 ENCOUNTER — Inpatient Hospital Stay (HOSPITAL_COMMUNITY): Admit: 2016-12-10 | Payer: Medicare HMO | Admitting: Neurosurgery

## 2016-12-10 ENCOUNTER — Other Ambulatory Visit: Payer: Medicare HMO

## 2016-12-10 ENCOUNTER — Encounter (HOSPITAL_COMMUNITY): Payer: Self-pay

## 2016-12-10 DIAGNOSIS — M545 Low back pain: Secondary | ICD-10-CM | POA: Diagnosis not present

## 2016-12-10 DIAGNOSIS — M4316 Spondylolisthesis, lumbar region: Secondary | ICD-10-CM

## 2016-12-10 SURGERY — POSTERIOR LUMBAR FUSION 2 LEVEL
Anesthesia: General

## 2017-01-02 ENCOUNTER — Encounter: Payer: Self-pay | Admitting: Family Medicine

## 2017-01-03 ENCOUNTER — Other Ambulatory Visit: Payer: Self-pay | Admitting: Neurosurgery

## 2017-01-04 ENCOUNTER — Encounter: Payer: Self-pay | Admitting: Family Medicine

## 2017-01-10 ENCOUNTER — Other Ambulatory Visit: Payer: Self-pay | Admitting: Family Medicine

## 2017-01-28 ENCOUNTER — Ambulatory Visit (INDEPENDENT_AMBULATORY_CARE_PROVIDER_SITE_OTHER): Payer: Medicare HMO | Admitting: Internal Medicine

## 2017-01-28 ENCOUNTER — Encounter: Payer: Self-pay | Admitting: Internal Medicine

## 2017-01-28 VITALS — BP 142/82 | HR 95 | Temp 98.1°F | Wt 125.0 lb

## 2017-01-28 DIAGNOSIS — M545 Low back pain, unspecified: Secondary | ICD-10-CM

## 2017-01-28 DIAGNOSIS — M48061 Spinal stenosis, lumbar region without neurogenic claudication: Secondary | ICD-10-CM | POA: Diagnosis not present

## 2017-01-28 MED ORDER — OXYCODONE-ACETAMINOPHEN 5-325 MG PO TABS: 1.0000 | ORAL_TABLET | Freq: Four times a day (QID) | ORAL | 0 refills | Status: DC | PRN

## 2017-01-28 NOTE — Progress Notes (Signed)
Chief Complaint  Patient presents with  . Back Pain    ongoing for 7 months - worsening. Pain 10/10. Lower back pain. Pt is scheduled for surgery in 2.5 weeks.     HPI: [name] [name] 76 y.o.  sda   pcp not in office today  Here with husband pat of mine   Had acute lbp with sciatica right  Seen 5 18 rx with pt.   And to have   To have ns on back   l4 l5 spinal stenosis and facet  Disease and  A bulging disc?  But surgery And got post poned until novemeber 6   From oct 19          Last night her pain increase  And she  Had to come home from work .   Pain over 10/10    Some tingling legs no new weakness no fall or fever   NS office called no answer yet and  Thought to come her  instead of ed  For help     On advil. 6 + multiples  voltarn no help in past  Failed injections  hydrocodone didn't help much in June   Some nausea when took 2 . So not taking .  acetaminophen not helpful  ROS: See pertinent positives and negatives per HPI.  Past Medical History:  Diagnosis Date  . HYPERLIPIDEMIA 07/18/2008  . HYPERTENSION 07/18/2008  . OSTEOPOROSIS 07/25/2008    Family History  Problem Relation Age of Onset  . Hyperlipidemia Mother   . Hypertension Mother   . Aneurysm Father   . Stroke Father 40       cerebral aneurysm  . Hyperlipidemia Sister   . Hypertension Sister   . Cancer Paternal Aunt        breast  . Cancer Paternal Grandmother     Social History   Social History  . Marital status: Married    Spouse name: N/A  . Number of children: N/A  . Years of education: N/A   Social History Main Topics  . Smoking status: Never Smoker  . Smokeless tobacco: Never Used  . Alcohol use None  . Drug use: Unknown  . Sexual activity: Not Asked   Other Topics Concern  . None   Social History Narrative  . None    Outpatient Medications Prior to Visit  Medication Sig Dispense Refill  . amLODipine (NORVASC) 5 MG tablet TAKE 1 TABLET BY MOUTH EVERY DAY 90 tablet 2  . aspirin 81 MG  tablet Take 81 mg by mouth daily.      Marland Kitchen atorvastatin (LIPITOR) 20 MG tablet TAKE 1 TABLET BY MOUTH EVERY DAY 90 tablet 1  . ibuprofen (ADVIL,MOTRIN) 200 MG tablet Take 400 mg by mouth every 8 (eight) hours as needed for mild pain.    Marland Kitchen losartan-hydrochlorothiazide (HYZAAR) 100-12.5 MG tablet Take 1 tablet by mouth daily. 30 tablet 5  . Multiple Vitamins-Minerals (WOMENS 50+ MULTI VITAMIN/MIN PO) Take 1 tablet by mouth daily.      No facility-administered medications prior to visit.      EXAM:  BP (!) 142/82 (BP Location: Right Arm, Patient Position: Sitting, Cuff Size: Normal)   Pulse 95   Temp 98.1 F (36.7 C) (Oral)   Wt 125 lb (56.7 kg)   BMI 21.80 kg/m   Body mass index is 21.8 kg/m.  GENERAL: vitals reviewed and listed above, alert, oriented, appears well hydrated and in mild moderated  Pain  uncomfortable  Walks bent over  Like spinal stenosis   Gait  Antalgic not weak   pain area lower l;s spine   No flank pain noted   No point pelvic pain tenderness at  Ileac crest   Toe heel  Ok  No focal  PSYCH: pleasant and cooperative, no obvious depression or anxiety   ASSESSMENT AND PLAN:  Discussed the following assessment and plan:  Severe lumbar pain acute on chronic  - seen in pcp office  for expediency and  NS office not yet  contacted them  nsiads not working can try 3-4 days for acute pain  lmimted  oxy codone   Spinal stenosis of lumbar region, unspecified whether neurogenic claudication present Awaiting surgery .  Plan is to have the neurosurgeon take over any pain management is appropriate. But to tell them what we prescribed. For acute pain. I don't see any evidence of new neurologic deterioration injury or fall. [name] Renshaw, MD Risk benefit of medication discussed. -Patient advised to return or notify health care team  if symptoms worsen ,persist or new concerns arise.  Patient Instructions  Try pain  med for now  For sever  Pain   But have NS  Office to take  over any pain management     .       Neta Mends. [name] [name] M.D.

## 2017-01-28 NOTE — Patient Instructions (Signed)
Try pain  med for now  For sever  Pain   But have NS  Office to take over any pain management     .

## 2017-01-29 ENCOUNTER — Inpatient Hospital Stay (HOSPITAL_COMMUNITY): Admission: RE | Admit: 2017-01-29 | Payer: Medicare HMO | Source: Ambulatory Visit

## 2017-02-05 ENCOUNTER — Encounter (HOSPITAL_COMMUNITY): Payer: Self-pay

## 2017-02-05 ENCOUNTER — Encounter (HOSPITAL_COMMUNITY)
Admission: RE | Admit: 2017-02-05 | Discharge: 2017-02-05 | Disposition: A | Discharge status: Home / Self Care | Payer: Medicare HMO | Source: Ambulatory Visit | Attending: Neurosurgery | Admitting: Neurosurgery

## 2017-02-05 DIAGNOSIS — I10 Essential (primary) hypertension: Secondary | ICD-10-CM | POA: Diagnosis not present

## 2017-02-05 DIAGNOSIS — Z01810 Encounter for preprocedural cardiovascular examination: Secondary | ICD-10-CM | POA: Insufficient documentation

## 2017-02-05 DIAGNOSIS — Z01812 Encounter for preprocedural laboratory examination: Secondary | ICD-10-CM | POA: Insufficient documentation

## 2017-02-05 LAB — TYPE AND SCREEN
ABO/RH(D): B NEG
Antibody Screen: NEGATIVE

## 2017-02-05 LAB — CBC
HEMATOCRIT: 35.8 % — AB (ref 36.0–46.0)
HEMOGLOBIN: 12.4 g/dL (ref 12.0–15.0)
MCH: 33.1 pg (ref 26.0–34.0)
MCHC: 34.6 g/dL (ref 30.0–36.0)
MCV: 95.5 fL (ref 78.0–100.0)
Platelets: 325 10*3/uL (ref 150–400)
RBC: 3.75 MIL/uL — ABNORMAL LOW (ref 3.87–5.11)
RDW: 14.2 % (ref 11.5–15.5)
WBC: 9.4 10*3/uL (ref 4.0–10.5)

## 2017-02-05 LAB — BASIC METABOLIC PANEL
ANION GAP: 9 (ref 5–15)
BUN: 11 mg/dL (ref 6–20)
CHLORIDE: 96 mmol/L — AB (ref 101–111)
CO2: 29 mmol/L (ref 22–32)
CREATININE: 0.91 mg/dL (ref 0.44–1.00)
Calcium: 9.7 mg/dL (ref 8.9–10.3)
GFR, EST NON AFRICAN AMERICAN: 59 mL/min — AB (ref >60)
Glucose, Bld: 146 mg/dL — ABNORMAL HIGH (ref 65–99)
Potassium: 3.9 mmol/L (ref 3.5–5.1)
Sodium: 134 mmol/L — ABNORMAL LOW (ref 135–145)

## 2017-02-05 LAB — ABO/RH: ABO/RH(D): B NEG

## 2017-02-05 LAB — SURGICAL PCR SCREEN
MRSA, PCR: NEGATIVE
STAPHYLOCOCCUS AUREUS: NEGATIVE

## 2017-02-06 ENCOUNTER — Other Ambulatory Visit: Payer: Self-pay | Admitting: Internal Medicine

## 2017-02-06 ENCOUNTER — Other Ambulatory Visit: Payer: Self-pay | Admitting: Family Medicine

## 2017-02-07 ENCOUNTER — Encounter: Payer: Self-pay | Admitting: Family Medicine

## 2017-02-16 NOTE — Anesthesia Preprocedure Evaluation (Addendum)
Anesthesia Evaluation  Patient identified by MRN, date of birth, ID band Patient awake    Reviewed: Allergy & Precautions, NPO status , Patient's Chart, lab work & pertinent test results  Airway Mallampati: II  TM Distance: >3 FB Neck ROM: Full    Dental no notable dental hx. (+) Teeth Intact, Chipped,    Pulmonary neg pulmonary ROS,    Pulmonary exam normal breath sounds clear to auscultation       Cardiovascular hypertension, Pt. on medications Normal cardiovascular exam Rhythm:Regular Rate:Normal     Neuro/Psych negative neurological ROS  negative psych ROS   GI/Hepatic negative GI ROS, Neg liver ROS,   Endo/Other  Hyperlipidemia   Renal/GU negative Renal ROS  negative genitourinary   Musculoskeletal  (+) Arthritis , Lumbar Stenosis with neurogenic claudication    Abdominal   Peds  Hematology negative hematology ROS (+)   Anesthesia Other Findings   Reproductive/Obstetrics                           Anesthesia Physical Anesthesia Plan  ASA: III  Anesthesia Plan:    Post-op Pain Management:    Induction: Intravenous  PONV Risk Score and Plan: 4 or greater and Propofol infusion, Dexamethasone, Ondansetron, Treatment may vary due to age or medical condition and Metaclopromide  Airway Management Planned: Oral ETT  Additional Equipment:   Intra-op Plan:   Post-operative Plan: Extubation in OR  Informed Consent: I have reviewed the patients History and Physical, chart, labs and discussed the procedure including the risks, benefits and alternatives for the proposed anesthesia with the patient or authorized representative who has indicated his/her understanding and acceptance.   Dental advisory given  Plan Discussed with: CRNA, Anesthesiologist and Surgeon  Anesthesia Plan Comments:        Anesthesia Quick Evaluation

## 2017-02-17 ENCOUNTER — Inpatient Hospital Stay (HOSPITAL_COMMUNITY): Payer: Medicare HMO | Admitting: Anesthesiology

## 2017-02-17 ENCOUNTER — Encounter (HOSPITAL_COMMUNITY): Payer: Self-pay | Admitting: Certified Registered Nurse Anesthetist

## 2017-02-17 ENCOUNTER — Inpatient Hospital Stay (HOSPITAL_COMMUNITY): Payer: Medicare HMO

## 2017-02-17 ENCOUNTER — Inpatient Hospital Stay (HOSPITAL_COMMUNITY)
Admission: RE | Admit: 2017-02-17 | Discharge: 2017-02-19 | DRG: 455 | Disposition: A | Discharge status: Home / Self Care | Payer: Medicare HMO | Source: Ambulatory Visit | Attending: Neurosurgery | Admitting: Neurosurgery

## 2017-02-17 ENCOUNTER — Encounter (HOSPITAL_COMMUNITY)
Admission: RE | Disposition: A | Discharge status: Home / Self Care | Payer: Self-pay | Source: Ambulatory Visit | Attending: Neurosurgery

## 2017-02-17 DIAGNOSIS — Z7982 Long term (current) use of aspirin: Secondary | ICD-10-CM | POA: Diagnosis not present

## 2017-02-17 DIAGNOSIS — M47816 Spondylosis without myelopathy or radiculopathy, lumbar region: Secondary | ICD-10-CM | POA: Diagnosis not present

## 2017-02-17 DIAGNOSIS — Z419 Encounter for procedure for purposes other than remedying health state, unspecified: Secondary | ICD-10-CM

## 2017-02-17 DIAGNOSIS — M48061 Spinal stenosis, lumbar region without neurogenic claudication: Secondary | ICD-10-CM | POA: Diagnosis not present

## 2017-02-17 DIAGNOSIS — E785 Hyperlipidemia, unspecified: Secondary | ICD-10-CM | POA: Diagnosis present

## 2017-02-17 DIAGNOSIS — M48062 Spinal stenosis, lumbar region with neurogenic claudication: Secondary | ICD-10-CM | POA: Diagnosis present

## 2017-02-17 DIAGNOSIS — M5126 Other intervertebral disc displacement, lumbar region: Secondary | ICD-10-CM | POA: Diagnosis present

## 2017-02-17 DIAGNOSIS — M810 Age-related osteoporosis without current pathological fracture: Secondary | ICD-10-CM | POA: Diagnosis present

## 2017-02-17 DIAGNOSIS — I10 Essential (primary) hypertension: Secondary | ICD-10-CM | POA: Diagnosis not present

## 2017-02-17 DIAGNOSIS — M4316 Spondylolisthesis, lumbar region: Secondary | ICD-10-CM | POA: Diagnosis not present

## 2017-02-17 DIAGNOSIS — Z79899 Other long term (current) drug therapy: Secondary | ICD-10-CM

## 2017-02-17 SURGERY — POSTERIOR LUMBAR FUSION 2 LEVEL
Anesthesia: General | Site: Back

## 2017-02-17 MED ORDER — LIDOCAINE-EPINEPHRINE 1 %-1:100000 IJ SOLN
INTRAMUSCULAR | Status: AC
  Filled 2017-02-17: qty 1

## 2017-02-17 MED ORDER — THROMBIN (RECOMBINANT) 5000 UNITS EX SOLR
CUTANEOUS | Status: DC | PRN
  Administered 2017-02-17: 5000 [IU] via TOPICAL

## 2017-02-17 MED ORDER — MENTHOL 3 MG MT LOZG: 1.0000 | LOZENGE | OROMUCOSAL | Status: DC | PRN

## 2017-02-17 MED ORDER — NEOSTIGMINE METHYLSULFATE 10 MG/10ML IV SOLN
INTRAVENOUS | Status: DC | PRN
  Administered 2017-02-17: 2.5 mg via INTRAVENOUS

## 2017-02-17 MED ORDER — ONDANSETRON HCL 4 MG/2ML IJ SOLN
INTRAMUSCULAR | Status: AC
  Filled 2017-02-17: qty 2

## 2017-02-17 MED ORDER — HYDROMORPHONE HCL 1 MG/ML IJ SOLN
INTRAMUSCULAR | Status: AC
  Filled 2017-02-17: qty 1

## 2017-02-17 MED ORDER — PHENYLEPHRINE 40 MCG/ML (10ML) SYRINGE FOR IV PUSH (FOR BLOOD PRESSURE SUPPORT)
PREFILLED_SYRINGE | INTRAVENOUS | Status: AC
  Filled 2017-02-17: qty 10

## 2017-02-17 MED ORDER — ONDANSETRON HCL 4 MG/2ML IJ SOLN
INTRAMUSCULAR | Status: DC | PRN
  Administered 2017-02-17: 4 mg via INTRAVENOUS

## 2017-02-17 MED ORDER — ACETAMINOPHEN 650 MG RE SUPP: 650.0000 mg | RECTAL | Status: DC | PRN

## 2017-02-17 MED ORDER — SODIUM CHLORIDE 0.9% FLUSH
3.0000 mL | Freq: Two times a day (BID) | INTRAVENOUS | Status: DC
  Administered 2017-02-17 (×2): 3 mL via INTRAVENOUS

## 2017-02-17 MED ORDER — PROPOFOL 10 MG/ML IV BOLUS
INTRAVENOUS | Status: DC | PRN
  Administered 2017-02-17: 80 mg via INTRAVENOUS

## 2017-02-17 MED ORDER — PHENOL 1.4 % MT LIQD: 1.0000 | OROMUCOSAL | Status: DC | PRN

## 2017-02-17 MED ORDER — METHOCARBAMOL 1000 MG/10ML IJ SOLN
500.0000 mg | Freq: Four times a day (QID) | INTRAMUSCULAR | Status: DC | PRN
  Filled 2017-02-17: qty 5

## 2017-02-17 MED ORDER — ONDANSETRON HCL 4 MG PO TABS: 4.0000 mg | ORAL_TABLET | Freq: Four times a day (QID) | ORAL | Status: DC | PRN

## 2017-02-17 MED ORDER — MORPHINE SULFATE (PF) 4 MG/ML IV SOLN: 2.0000 mg | INTRAVENOUS | Status: DC | PRN

## 2017-02-17 MED ORDER — ATORVASTATIN CALCIUM 20 MG PO TABS
20.0000 mg | ORAL_TABLET | Freq: Every day | ORAL | Status: DC
  Administered 2017-02-17 – 2017-02-18 (×2): 20 mg via ORAL
  Filled 2017-02-17 (×2): qty 1

## 2017-02-17 MED ORDER — 0.9 % SODIUM CHLORIDE (POUR BTL) OPTIME
TOPICAL | Status: DC | PRN
  Administered 2017-02-17: 1000 mL

## 2017-02-17 MED ORDER — CEFAZOLIN SODIUM-DEXTROSE 2-4 GM/100ML-% IV SOLN
INTRAVENOUS | Status: AC
  Filled 2017-02-17: qty 100

## 2017-02-17 MED ORDER — MEPERIDINE HCL 25 MG/ML IJ SOLN: 6.2500 mg | INTRAMUSCULAR | Status: DC | PRN

## 2017-02-17 MED ORDER — LIDOCAINE HCL (CARDIAC) 20 MG/ML IV SOLN
INTRAVENOUS | Status: DC | PRN
  Administered 2017-02-17: 40 mg via INTRAVENOUS

## 2017-02-17 MED ORDER — ROCURONIUM BROMIDE 10 MG/ML (PF) SYRINGE
PREFILLED_SYRINGE | INTRAVENOUS | Status: AC
  Filled 2017-02-17: qty 10

## 2017-02-17 MED ORDER — ONDANSETRON HCL 4 MG/2ML IJ SOLN
4.0000 mg | Freq: Four times a day (QID) | INTRAMUSCULAR | Status: DC | PRN
  Administered 2017-02-17: 4 mg via INTRAVENOUS
  Filled 2017-02-17: qty 2

## 2017-02-17 MED ORDER — SODIUM CHLORIDE 0.9 % IR SOLN
Status: DC | PRN
  Administered 2017-02-17: 09:00:00

## 2017-02-17 MED ORDER — BUPIVACAINE HCL (PF) 0.5 % IJ SOLN
INTRAMUSCULAR | Status: AC
  Filled 2017-02-17: qty 30

## 2017-02-17 MED ORDER — SODIUM CHLORIDE 0.9 % IV SOLN: INTRAVENOUS | Status: DC

## 2017-02-17 MED ORDER — DOCUSATE SODIUM 100 MG PO CAPS
100.0000 mg | ORAL_CAPSULE | Freq: Two times a day (BID) | ORAL | Status: DC
  Administered 2017-02-17 – 2017-02-18 (×3): 100 mg via ORAL
  Filled 2017-02-17 (×4): qty 1

## 2017-02-17 MED ORDER — THROMBIN (RECOMBINANT) 5000 UNITS EX SOLR
CUTANEOUS | Status: AC
  Filled 2017-02-17: qty 10000

## 2017-02-17 MED ORDER — BUPIVACAINE HCL (PF) 0.5 % IJ SOLN
INTRAMUSCULAR | Status: DC | PRN
  Administered 2017-02-17: 5 mL

## 2017-02-17 MED ORDER — LACTATED RINGERS IV SOLN
INTRAVENOUS | Status: DC | PRN
  Administered 2017-02-17 (×2): via INTRAVENOUS

## 2017-02-17 MED ORDER — ADULT MULTIVITAMIN W/MINERALS CH: ORAL_TABLET | Freq: Every day | ORAL | Status: DC

## 2017-02-17 MED ORDER — CEFAZOLIN SODIUM-DEXTROSE 2-4 GM/100ML-% IV SOLN
2.0000 g | INTRAVENOUS | Status: AC
  Administered 2017-02-17: 2 g via INTRAVENOUS

## 2017-02-17 MED ORDER — FENTANYL CITRATE (PF) 250 MCG/5ML IJ SOLN
INTRAMUSCULAR | Status: AC
  Filled 2017-02-17: qty 5

## 2017-02-17 MED ORDER — CHLORHEXIDINE GLUCONATE CLOTH 2 % EX PADS: 6.0000 | MEDICATED_PAD | Freq: Once | CUTANEOUS | Status: DC

## 2017-02-17 MED ORDER — LOSARTAN POTASSIUM 50 MG PO TABS
100.0000 mg | ORAL_TABLET | Freq: Every day | ORAL | Status: DC
  Administered 2017-02-17: 100 mg via ORAL
  Filled 2017-02-17: qty 2

## 2017-02-17 MED ORDER — ROCURONIUM BROMIDE 100 MG/10ML IV SOLN
INTRAVENOUS | Status: DC | PRN
  Administered 2017-02-17: 40 mg via INTRAVENOUS
  Administered 2017-02-17: 50 mg via INTRAVENOUS
  Administered 2017-02-17: 30 mg via INTRAVENOUS

## 2017-02-17 MED ORDER — ACETAMINOPHEN 325 MG PO TABS: 650.0000 mg | ORAL_TABLET | ORAL | Status: DC | PRN

## 2017-02-17 MED ORDER — DEXAMETHASONE SODIUM PHOSPHATE 10 MG/ML IJ SOLN
INTRAMUSCULAR | Status: AC
  Filled 2017-02-17: qty 1

## 2017-02-17 MED ORDER — PHENYLEPHRINE HCL 10 MG/ML IJ SOLN
INTRAVENOUS | Status: DC | PRN
  Administered 2017-02-17: 20 ug/min via INTRAVENOUS

## 2017-02-17 MED ORDER — PROPOFOL 10 MG/ML IV BOLUS
INTRAVENOUS | Status: AC
  Filled 2017-02-17: qty 20

## 2017-02-17 MED ORDER — METHOCARBAMOL 500 MG PO TABS
500.0000 mg | ORAL_TABLET | Freq: Four times a day (QID) | ORAL | Status: DC | PRN
  Administered 2017-02-17 – 2017-02-19 (×5): 500 mg via ORAL
  Filled 2017-02-17 (×5): qty 1

## 2017-02-17 MED ORDER — BISACODYL 10 MG RE SUPP: 10.0000 mg | Freq: Every day | RECTAL | Status: DC | PRN

## 2017-02-17 MED ORDER — OXYCODONE HCL 5 MG PO TABS
5.0000 mg | ORAL_TABLET | ORAL | Status: DC | PRN
  Administered 2017-02-17 – 2017-02-19 (×3): 5 mg via ORAL
  Filled 2017-02-17 (×4): qty 1

## 2017-02-17 MED ORDER — EPHEDRINE SULFATE 50 MG/ML IJ SOLN
INTRAMUSCULAR | Status: DC | PRN
  Administered 2017-02-17 (×2): 5 mg via INTRAVENOUS

## 2017-02-17 MED ORDER — PHENYLEPHRINE HCL 10 MG/ML IJ SOLN
INTRAMUSCULAR | Status: DC | PRN
  Administered 2017-02-17: 40 ug via INTRAVENOUS
  Administered 2017-02-17: 10 ug via INTRAVENOUS
  Administered 2017-02-17: 80 ug via INTRAVENOUS
  Administered 2017-02-17 (×2): 40 ug via INTRAVENOUS
  Administered 2017-02-17: 80 ug via INTRAVENOUS
  Administered 2017-02-17: 40 ug via INTRAVENOUS

## 2017-02-17 MED ORDER — METOCLOPRAMIDE HCL 5 MG/ML IJ SOLN: 10.0000 mg | Freq: Once | INTRAMUSCULAR | Status: DC | PRN

## 2017-02-17 MED ORDER — LOSARTAN POTASSIUM-HCTZ 100-12.5 MG PO TABS: 1.0000 | ORAL_TABLET | Freq: Every day | ORAL | Status: DC

## 2017-02-17 MED ORDER — MIDAZOLAM HCL 5 MG/5ML IJ SOLN
INTRAMUSCULAR | Status: DC | PRN
  Administered 2017-02-17 (×2): 1 mg via INTRAVENOUS

## 2017-02-17 MED ORDER — CEFAZOLIN SODIUM 1 G IJ SOLR
INTRAMUSCULAR | Status: AC
  Filled 2017-02-17: qty 20

## 2017-02-17 MED ORDER — HEMOSTATIC AGENTS (NO CHARGE) OPTIME
TOPICAL | Status: DC | PRN
  Administered 2017-02-17: 1 via TOPICAL

## 2017-02-17 MED ORDER — FENTANYL CITRATE (PF) 100 MCG/2ML IJ SOLN
INTRAMUSCULAR | Status: DC | PRN
  Administered 2017-02-17: 100 ug via INTRAVENOUS
  Administered 2017-02-17 (×3): 50 ug via INTRAVENOUS

## 2017-02-17 MED ORDER — PANTOPRAZOLE SODIUM 40 MG IV SOLR
40.0000 mg | Freq: Every day | INTRAVENOUS | Status: DC
  Administered 2017-02-17: 40 mg via INTRAVENOUS
  Filled 2017-02-17: qty 40

## 2017-02-17 MED ORDER — LIDOCAINE-EPINEPHRINE 1 %-1:100000 IJ SOLN
INTRAMUSCULAR | Status: DC | PRN
  Administered 2017-02-17: 5 mL

## 2017-02-17 MED ORDER — HYDROCHLOROTHIAZIDE 12.5 MG PO CAPS
12.5000 mg | ORAL_CAPSULE | Freq: Every day | ORAL | Status: DC
  Administered 2017-02-17: 12.5 mg via ORAL
  Filled 2017-02-17 (×2): qty 1

## 2017-02-17 MED ORDER — CALCIUM CARBONATE-VITAMIN D 500-200 MG-UNIT PO TABS
1.0000 | ORAL_TABLET | Freq: Every day | ORAL | Status: DC
  Administered 2017-02-18: 1 via ORAL
  Filled 2017-02-17 (×2): qty 1

## 2017-02-17 MED ORDER — THROMBIN (RECOMBINANT) 20000 UNITS EX SOLR
CUTANEOUS | Status: AC
  Filled 2017-02-17: qty 20000

## 2017-02-17 MED ORDER — AMLODIPINE BESYLATE 5 MG PO TABS
5.0000 mg | ORAL_TABLET | Freq: Every day | ORAL | Status: DC
  Administered 2017-02-18: 5 mg via ORAL
  Filled 2017-02-17 (×3): qty 1

## 2017-02-17 MED ORDER — HYDROMORPHONE HCL 1 MG/ML IJ SOLN
0.2500 mg | INTRAMUSCULAR | Status: DC | PRN
  Administered 2017-02-17: 0.5 mg via INTRAVENOUS

## 2017-02-17 MED ORDER — CEFAZOLIN SODIUM-DEXTROSE 2-4 GM/100ML-% IV SOLN
2.0000 g | Freq: Three times a day (TID) | INTRAVENOUS | Status: AC
  Administered 2017-02-17 (×2): 2 g via INTRAVENOUS
  Filled 2017-02-17 (×2): qty 100

## 2017-02-17 MED ORDER — SENNA 8.6 MG PO TABS
1.0000 | ORAL_TABLET | Freq: Two times a day (BID) | ORAL | Status: DC
  Administered 2017-02-17 – 2017-02-18 (×3): 8.6 mg via ORAL
  Filled 2017-02-17 (×3): qty 1

## 2017-02-17 MED ORDER — NEOSTIGMINE METHYLSULFATE 5 MG/5ML IV SOSY
PREFILLED_SYRINGE | INTRAVENOUS | Status: AC
  Filled 2017-02-17: qty 5

## 2017-02-17 MED ORDER — DEXAMETHASONE SODIUM PHOSPHATE 10 MG/ML IJ SOLN
INTRAMUSCULAR | Status: DC | PRN
  Administered 2017-02-17: 10 mg via INTRAVENOUS

## 2017-02-17 MED ORDER — MIDAZOLAM HCL 2 MG/2ML IJ SOLN
INTRAMUSCULAR | Status: AC
Start: 2017-02-17 — End: ?
  Filled 2017-02-17: qty 2

## 2017-02-17 MED ORDER — SODIUM CHLORIDE 0.9% FLUSH: 3.0000 mL | INTRAVENOUS | Status: DC | PRN

## 2017-02-17 MED ORDER — GLYCOPYRROLATE 0.2 MG/ML IJ SOLN
INTRAMUSCULAR | Status: DC | PRN
Start: 2017-02-17 — End: 2017-02-17
  Administered 2017-02-17: .5 mg via INTRAVENOUS

## 2017-02-17 MED ORDER — LIDOCAINE 2% (20 MG/ML) 5 ML SYRINGE
INTRAMUSCULAR | Status: AC
  Filled 2017-02-17: qty 5

## 2017-02-17 MED ORDER — OXYCODONE HCL 5 MG PO TABS
10.0000 mg | ORAL_TABLET | ORAL | Status: DC | PRN
  Administered 2017-02-17 – 2017-02-18 (×4): 10 mg via ORAL
  Filled 2017-02-17 (×4): qty 2

## 2017-02-17 SURGICAL SUPPLY — 87 items
ADH SKN CLS APL DERMABOND .7 (GAUZE/BANDAGES/DRESSINGS) ×1
APL SKNCLS STERI-STRIP NONHPOA (GAUZE/BANDAGES/DRESSINGS)
BAG DECANTER FOR FLEXI CONT (MISCELLANEOUS) ×3 IMPLANT
BASKET BONE COLLECTION (BASKET) ×3 IMPLANT
BENZOIN TINCTURE PRP APPL 2/3 (GAUZE/BANDAGES/DRESSINGS) IMPLANT
BIT DRILL 3.5 POWEREASE (BIT) ×1 IMPLANT
BIT DRILL 3.5MM POWEREASE (BIT) ×1
BLADE CLIPPER SURG (BLADE) IMPLANT
BLADE SURG 11 STRL SS (BLADE) ×3 IMPLANT
BUR MATCHSTICK NEURO 3.0 LAGG (BURR) ×3 IMPLANT
BUR PRECISION FLUTE 5.0 (BURR) ×3 IMPLANT
CANISTER SUCT 3000ML PPV (MISCELLANEOUS) ×3 IMPLANT
CARTRIDGE OIL MAESTRO DRILL (MISCELLANEOUS) ×1 IMPLANT
CLOSURE WOUND 1/2 X4 (GAUZE/BANDAGES/DRESSINGS)
CONT SPEC 4OZ CLIKSEAL STRL BL (MISCELLANEOUS) ×3 IMPLANT
COVER BACK TABLE 60X90IN (DRAPES) ×3 IMPLANT
DECANTER SPIKE VIAL GLASS SM (MISCELLANEOUS) ×3 IMPLANT
DERMABOND ADVANCED (GAUZE/BANDAGES/DRESSINGS) ×2
DERMABOND ADVANCED .7 DNX12 (GAUZE/BANDAGES/DRESSINGS) ×1 IMPLANT
DEVICE INTERBODY ELEVATE 23X8 (Cage) ×12 IMPLANT
DIFFUSER DRILL AIR PNEUMATIC (MISCELLANEOUS) ×3 IMPLANT
DRAPE C-ARM 42X72 X-RAY (DRAPES) ×3 IMPLANT
DRAPE C-ARMOR (DRAPES) ×3 IMPLANT
DRAPE LAPAROTOMY 100X72X124 (DRAPES) ×3 IMPLANT
DRAPE POUCH INSTRU U-SHP 10X18 (DRAPES) ×3 IMPLANT
DRAPE SURG 17X23 STRL (DRAPES) ×1 IMPLANT
DRSG OPSITE POSTOP 4X6 (GAUZE/BANDAGES/DRESSINGS) ×2 IMPLANT
DURAPREP 26ML APPLICATOR (WOUND CARE) ×3 IMPLANT
ELECT REM PT RETURN 9FT ADLT (ELECTROSURGICAL) ×3
ELECTRODE REM PT RTRN 9FT ADLT (ELECTROSURGICAL) ×1 IMPLANT
GAUZE SPONGE 4X4 12PLY STRL (GAUZE/BANDAGES/DRESSINGS) IMPLANT
GAUZE SPONGE 4X4 16PLY XRAY LF (GAUZE/BANDAGES/DRESSINGS) IMPLANT
GLOVE BIO SURGEON STRL SZ7.5 (GLOVE) ×2 IMPLANT
GLOVE BIO SURGEON STRL SZ8 (GLOVE) ×2 IMPLANT
GLOVE BIO SURGEON STRL SZ8.5 (GLOVE) ×2 IMPLANT
GLOVE BIOGEL PI IND STRL 6.5 (GLOVE) IMPLANT
GLOVE BIOGEL PI IND STRL 7.5 (GLOVE) ×2 IMPLANT
GLOVE BIOGEL PI INDICATOR 6.5 (GLOVE) ×4
GLOVE BIOGEL PI INDICATOR 7.5 (GLOVE) ×6
GLOVE ECLIPSE 7.0 STRL STRAW (GLOVE) ×5 IMPLANT
GLOVE EXAM NITRILE LRG STRL (GLOVE) IMPLANT
GLOVE EXAM NITRILE XL STR (GLOVE) IMPLANT
GLOVE EXAM NITRILE XS STR PU (GLOVE) IMPLANT
GLOVE SURG SS PI 6.5 STRL IVOR (GLOVE) ×10 IMPLANT
GOWN STRL REUS W/ TWL LRG LVL3 (GOWN DISPOSABLE) ×2 IMPLANT
GOWN STRL REUS W/ TWL XL LVL3 (GOWN DISPOSABLE) IMPLANT
GOWN STRL REUS W/TWL 2XL LVL3 (GOWN DISPOSABLE) IMPLANT
GOWN STRL REUS W/TWL LRG LVL3 (GOWN DISPOSABLE) ×15
GOWN STRL REUS W/TWL XL LVL3 (GOWN DISPOSABLE) ×3
HEMOSTAT POWDER KIT SURGIFOAM (HEMOSTASIS) ×3 IMPLANT
KIT BASIN OR (CUSTOM PROCEDURE TRAY) ×3 IMPLANT
KIT INFUSE X SMALL 1.4CC (Orthopedic Implant) ×2 IMPLANT
KIT POSITION SURG JACKSON T1 (MISCELLANEOUS) ×3 IMPLANT
KIT ROOM TURNOVER OR (KITS) ×3 IMPLANT
MILL MEDIUM DISP (BLADE) ×3 IMPLANT
NDL HYPO 18GX1.5 BLUNT FILL (NEEDLE) IMPLANT
NDL SPNL 18GX3.5 QUINCKE PK (NEEDLE) IMPLANT
NDL SPNL 20GX3.5 QUINCKE YW (NEEDLE) IMPLANT
NEEDLE HYPO 18GX1.5 BLUNT FILL (NEEDLE) IMPLANT
NEEDLE HYPO 22GX1.5 SAFETY (NEEDLE) ×3 IMPLANT
NEEDLE SPNL 18GX3.5 QUINCKE PK (NEEDLE) IMPLANT
NEEDLE SPNL 20GX3.5 QUINCKE YW (NEEDLE) ×3 IMPLANT
NS IRRIG 1000ML POUR BTL (IV SOLUTION) ×3 IMPLANT
OIL CARTRIDGE MAESTRO DRILL (MISCELLANEOUS) ×3
PACK LAMINECTOMY NEURO (CUSTOM PROCEDURE TRAY) ×3 IMPLANT
PAD ARMBOARD 7.5X6 YLW CONV (MISCELLANEOUS) ×9 IMPLANT
ROD 40MM (Rod) ×12 IMPLANT
ROD SPNL CVD 40X4.75X (Rod) IMPLANT
SCREW 5.5X30MM (Screw) ×8 IMPLANT
SCREW 5.5X35MM (Screw) ×12 IMPLANT
SCREW BN 35X5.5XMA NS SPNE (Screw) IMPLANT
SCREW SET SOLERA (Screw) ×24 IMPLANT
SCREW SET SOLERA TI (Screw) IMPLANT
SPACER SPNL XLORDOTIC 23X8X (Cage) IMPLANT
SPCR SPNL XLORDOTIC 23X8X (Cage) ×4 IMPLANT
SPONGE LAP 4X18 X RAY DECT (DISPOSABLE) IMPLANT
SPONGE SURGIFOAM ABS GEL 100 (HEMOSTASIS) ×1 IMPLANT
STRIP CLOSURE SKIN 1/2X4 (GAUZE/BANDAGES/DRESSINGS) IMPLANT
SUT VIC AB 0 CT1 18XCR BRD8 (SUTURE) ×1 IMPLANT
SUT VIC AB 0 CT1 8-18 (SUTURE) ×6
SUT VIC AB 3-0 FS2 27 (SUTURE) IMPLANT
SUT VICRYL 3-0 RB1 18 ABS (SUTURE) ×5 IMPLANT
SYR 3ML LL SCALE MARK (SYRINGE) IMPLANT
TOWEL GREEN STERILE (TOWEL DISPOSABLE) ×3 IMPLANT
TOWEL GREEN STERILE FF (TOWEL DISPOSABLE) ×1 IMPLANT
TRAY FOLEY W/METER SILVER 16FR (SET/KITS/TRAYS/PACK) ×3 IMPLANT
WATER STERILE IRR 1000ML POUR (IV SOLUTION) ×3 IMPLANT

## 2017-02-17 NOTE — H&P (Signed)
Chief Complaint     History of Present Illness  [name] [name] is a 76 year old woman I am seeing in follow-up. We have been managing her back and leg pain conservatively. She has stenosis of multifactorial origin at L2-3, as well as facet arthropathy with spondylolisthesis at L4-5. At our last visit we had discussed and tentatively scheduled surgery. At our last visit they indicated that the epidural steroid injection and facet injection done at the end of July was completely unhelpful, both she and her husband today say that maybe there has been a slight amount of improvement, noted primarily because of a decreased use of Advil at home.   Past Medical History   Past Medical History:  Diagnosis Date  . HYPERLIPIDEMIA 07/18/2008  . HYPERTENSION 07/18/2008  . OSTEOPOROSIS 07/25/2008    Past Surgical History   Past Surgical History:  Procedure Laterality Date  . LUMBAR DISC SURGERY  1998    Social History   Social History   Tobacco Use  . Smoking status: Never Smoker  . Smokeless tobacco: Never Used  Substance Use Topics  . Alcohol use: Yes    Comment: WINE  . Drug use: No    Medications   Prior to Admission medications   Medication Sig Start Date End Date Taking? Authorizing Provider  amLODipine (NORVASC) 5 MG tablet TAKE 1 TABLET BY MOUTH EVERY DAY Patient taking differently: TAKE 1 TABLET BY MOUTH EVERY DAY     takes before dinner 02/06/17  Yes Burchette, Elberta Fortis, MD  aspirin 81 MG tablet Take 81 mg by mouth daily.     Yes [provider]  atorvastatin (LIPITOR) 20 MG tablet TAKE 1 TABLET BY MOUTH EVERY DAY 01/10/17  Yes Burchette, Elberta Fortis, MD  Calcium Citrate-Vitamin D (CITRACAL/VITAMIN D PO) Take 1 tablet by mouth daily.   Yes [provider]  ibuprofen (ADVIL,MOTRIN) 200 MG tablet Take 400 mg by mouth every 8 (eight) hours as needed for mild pain.   Yes [provider]  losartan-hydrochlorothiazide (HYZAAR) 100-12.5 MG tablet TAKE 1 TABLET BY  MOUTH EVERY DAY 02/06/17  Yes Burchette, Elberta Fortis, MD  Multiple Vitamins-Minerals (WOMENS 50+ MULTI VITAMIN/MIN PO) Take 1 tablet by mouth daily.    Yes [provider]  oxyCODONE-acetaminophen (ROXICET) 5-325 MG tablet Take 1-2 tablets by mouth every 6 (six) hours as needed for severe pain. Patient taking differently: Take 1-2 tablets by mouth every 6 (six) hours as needed for severe pain. Has been filled by neurosurgeon 01/28/17  Yes Panosh, Neta Mends, MD    Allergies  No Known Allergies  Review of Systems  ROS  Neurologic Exam  Awake, alert, oriented Memory and concentration grossly intact Speech fluent, appropriate CN grossly intact Motor exam: Upper Extremities Deltoid Bicep Tricep Grip  Right 5/5 5/5 5/5 5/5  Left 5/5 5/5 5/5 5/5   Lower Extremities IP Quad PF DF EHL  Right 5/5 5/5 5/5 5/5 5/5  Left 5/5 5/5 5/5 5/5 5/5   Sensation grossly intact to LT  Imaging  MRI of the lumbar spine was again reviewed. This demonstrates primary finding at L2-3 and L4-5. At L2-3, there is multifactorial stenosis including broad-based disc bulge with bilateral facet arthropathy. At L4-5 there is grade 1 spondylolisthesis, with bilateral facet arthropathy.   Impression  76 year old woman with back and leg pain consistent with multifactorial stenosis at L2-3, and spondylolisthesis with facet arthropathy at L4-5.   Plan  we will plan on proceeding with decompression and fusion at L2-3  and L4-5. The intervening L3-4 disc space appears largely unremarkable. I will therefore plan on interbody devices at L2-3 and L4-5, and connect the segments with a single screw and rod construct.   I have reviewed with the patient and her husband in the office the treatment options this point, including continued conservative treatment, versus surgical decompression and fusion. I explained to the patient the details of the procedure, as well as the risks which include but are not limited to nerve root  injury leading to leg or foot weakness/numbness and/or bowel and bladder dysfunction, CSF leak, bleeding, and infection. Possible outcomes of surgery were also discussed including the possibility of persistence or worsening of pain symptoms and the possibility of accelerated adjacent level degeneration. The general risks of anesthesia were also reviewed including heart attack, stroke, and DVT/PE.   The patient understood our discussion and is willing to proceed with surgical decompression and fusion. All questions were answered.

## 2017-02-17 NOTE — Anesthesia Postprocedure Evaluation (Signed)
Anesthesia Post Note  Patient: [name] [name]  Procedure(s) Performed: POSTERIOR LUMBAR INTERBODY FUSION LUMBAR TWO- LUMBAR THREE, LUMBAR FOUR-LUMBAR FIVE (N/A Back)     Patient location during evaluation: PACU Anesthesia Type: General Level of consciousness: awake and alert and oriented Pain management: pain level controlled Vital Signs Assessment: post-procedure vital signs reviewed and stable Respiratory status: spontaneous breathing, nonlabored ventilation, respiratory function stable and patient connected to nasal cannula oxygen Cardiovascular status: blood pressure returned to baseline and stable Postop Assessment: no apparent nausea or vomiting Anesthetic complications: no    Last Vitals:  Vitals:   02/17/17 1318 02/17/17 1333  BP: 112/60 110/62  Pulse: 85 66  Resp: 16 15  Temp:    SpO2: 99% 100%    Last Pain:  Vitals:   02/17/17 1405  TempSrc:   PainSc: Asleep                 Shashank Kwasnik A.

## 2017-02-17 NOTE — Transfer of Care (Signed)
Immediate Anesthesia Transfer of Care Note  Patient: [name] [name]  Procedure(s) Performed: POSTERIOR LUMBAR INTERBODY FUSION LUMBAR TWO- LUMBAR THREE, LUMBAR FOUR-LUMBAR FIVE (N/A Back)  Patient Location: PACU  Anesthesia Type:General  Level of Consciousness: patient cooperative and responds to stimulation  Airway & Oxygen Therapy: Patient Spontanous Breathing and Patient connected to nasal cannula oxygen  Post-op Assessment: Report given to RN and Post -op Vital signs reviewed and stable  Post vital signs: Reviewed and stable  Last Vitals:  Vitals:   02/17/17 0617 02/17/17 1308  BP: (!) 172/84   Pulse: 89   Resp: 17   Temp: 36.6 C (P) 36.6 C  SpO2: 96%     Last Pain:  Vitals:   02/17/17 1308  TempSrc:   PainSc: (P) Asleep      Patients Stated Pain Goal: 2 (02/17/17 [id number])  Complications: No apparent anesthesia complications

## 2017-02-17 NOTE — Addendum Note (Signed)
Addendum  created 02/17/17 1411 by Mal AmabileFoster, Roselina Burgueno, MD   SmartForm saved

## 2017-02-17 NOTE — Progress Notes (Signed)
Orthopedic Tech Progress Note Patient Details:  [name] PealsJulia C Exantus 10/14/1940 [id number] Patient has brace. Patient ID: [name] [name], female   DOB: 01/14/1941, 76 y.o.   MRN: [id number]   [name] [name], [name] [name] 02/17/2017, 4:01 PM

## 2017-02-17 NOTE — Anesthesia Procedure Notes (Signed)
Procedure Name: Intubation Date/Time: 02/17/2017 7:42 AM Performed by: Josephine Igo, MD Pre-anesthesia Checklist: Patient identified, Emergency Drugs available, Suction available, Patient being monitored and Timeout performed Patient Re-evaluated:Patient Re-evaluated prior to induction Oxygen Delivery Method: Circle system utilized Preoxygenation: Pre-oxygenation with 100% oxygen Induction Type: IV induction and Cricoid Pressure applied Ventilation: Mask ventilation without difficulty Laryngoscope Size: Mac and 3 Grade View: Grade II Tube type: Oral Tube size: 7.0 mm Number of attempts: 1 Airway Equipment and Method: Stylet Placement Confirmation: ETT inserted through vocal cords under direct vision,  positive ETCO2 and breath sounds checked- equal and bilateral Secured at: 21 cm Tube secured with: Tape Comments: Intubation performed by Vickii Penna, SRNA

## 2017-02-18 LAB — BASIC METABOLIC PANEL
Anion gap: 10 (ref 5–15)
BUN: 11 mg/dL (ref 6–20)
CALCIUM: 8.9 mg/dL (ref 8.9–10.3)
CO2: 28 mmol/L (ref 22–32)
Chloride: 96 mmol/L — ABNORMAL LOW (ref 101–111)
Creatinine, Ser: 1.08 mg/dL — ABNORMAL HIGH (ref 0.44–1.00)
GFR, EST AFRICAN AMERICAN: 55 mL/min — AB (ref >60)
GFR, EST NON AFRICAN AMERICAN: 48 mL/min — AB (ref >60)
Glucose, Bld: 160 mg/dL — ABNORMAL HIGH (ref 65–99)
Potassium: 3.9 mmol/L (ref 3.5–5.1)
SODIUM: 134 mmol/L — AB (ref 135–145)

## 2017-02-18 LAB — CBC
HCT: 29.1 % — ABNORMAL LOW (ref 36.0–46.0)
Hemoglobin: 9.7 g/dL — ABNORMAL LOW (ref 12.0–15.0)
MCH: 32.2 pg (ref 26.0–34.0)
MCHC: 33.3 g/dL (ref 30.0–36.0)
MCV: 96.7 fL (ref 78.0–100.0)
Platelets: 246 10*3/uL (ref 150–400)
RBC: 3.01 MIL/uL — ABNORMAL LOW (ref 3.87–5.11)
RDW: 14 % (ref 11.5–15.5)
WBC: 10.1 10*3/uL (ref 4.0–10.5)

## 2017-02-18 MED ORDER — ASPIRIN 81 MG PO TABS: 81.0000 mg | ORAL_TABLET | Freq: Every day | ORAL | Status: DC

## 2017-02-18 MED ORDER — OXYCODONE HCL 5 MG PO TABS: 5.0000 mg | ORAL_TABLET | Freq: Four times a day (QID) | ORAL | 0 refills | Status: DC | PRN

## 2017-02-18 MED ORDER — METHOCARBAMOL 500 MG PO TABS: 500.0000 mg | ORAL_TABLET | Freq: Four times a day (QID) | ORAL | 0 refills | Status: DC | PRN

## 2017-02-18 MED ORDER — PANTOPRAZOLE SODIUM 40 MG PO TBEC
40.0000 mg | DELAYED_RELEASE_TABLET | Freq: Every day | ORAL | Status: DC
  Administered 2017-02-18: 40 mg via ORAL
  Filled 2017-02-18: qty 1

## 2017-02-18 MED ORDER — WHITE PETROLATUM EX OINT
TOPICAL_OINTMENT | CUTANEOUS | Status: AC
  Administered 2017-02-18: 07:00:00
  Filled 2017-02-18: qty 28.35

## 2017-02-18 MED ORDER — HYDROXYZINE HCL 50 MG/ML IM SOLN
50.0000 mg | INTRAMUSCULAR | Status: DC | PRN
  Administered 2017-02-18: 50 mg via INTRAMUSCULAR
  Filled 2017-02-18: qty 1

## 2017-02-18 NOTE — Progress Notes (Signed)
No issues overnight. Pt able to ambulate with min assist. Tolerating diet, back pain controlled with oral oxy. No real leg pain.  EXAM:  BP (!) 103/50 (BP Location: Left Arm)   Pulse 87   Temp (!) 97.5 F (36.4 C) (Oral)   Resp 18   SpO2 93%   Awake, alert, oriented  Speech fluent, appropriate  CN grossly intact  5/5 BUE/BLE   IMPRESSION:  76 y.o. female POD#1 s/p L2-3, L4-5 PLIF, doing well  PLAN: - Will allow her to void spontaneously then d/c home

## 2017-02-18 NOTE — Op Note (Signed)
PREOP DIAGNOSIS:  1.  Lumbar spinal stenosis with neurogenic claudication 2.  Spondylolisthesis, L4-5  POSTOP DIAGNOSIS: Same  PROCEDURE: 1.  L4 laminectomy with facetectomy for decompression of exiting nerve roots, more than would be required for placement of interbody graft 2. L2 laminectomy with complete facetectomy for decompression of exiting nerve roots, more than would be required for placement of interbody graft 3. Placement of anterior interbody device @ L2-3 Medtronic expandable 8mm cage x2 4. Placement of anterior interbody device @ L4-5 Medtronic expandable 8mm cage x2 5. Posterior instrumentation using cortical pedicle screws at L2-3, L4-5 6. Interbody arthrodesis, L2-3 7. Interbody arthrodesis, L4-5 7. Use of locally harvested bone autograft 8. Use of non-structural bone allograft - BMP  SURGEON: Dr. Lisbeth RenshawNeelesh Adain Geurin, MD  ASSISTANT: None  ANESTHESIA: General Endotracheal  EBL: 300cc  SPECIMENS: None  DRAINS: None  COMPLICATIONS: None immediate  CONDITION: Hemodynamically stable to PACU  HISTORY: [name] PealsJulia C [name] is a 76 y.o. female who has been followed in the outpatient clinic with back and leg pain related to spondylosis with spondylolisthesis and stenosis at L2-3, and L4-5. She attempted multiple conservative treatments and ultimately elected to proceed with surgical decompression and fusion. Risks and benefits were reviewed and consent was obtained.  PROCEDURE IN DETAIL: After informed consent was obtained and witnessed, the patient was brought to the operating room. After induction of general anesthesia, the patient was positioned on the operative table in the prone position. All pressure points were meticulously padded. Incision was then marked out and prepped and draped in the usual sterile fashion.  After timeout was conducted, skin was infiltrated with local anesthetic. Skin incision was then made sharply and Bovie electrocautery was used to dissect the  subcutaneous tissue until the lumbodorsal fascia was identified and incised. The muscle was then elevated in the subperiosteal plane and the lamina and medial facet complex was identified at L2-3, and L4-5.  Self-retaining retractors were then placed.  Our position was confirmed with intraoperative fluoroscopy.  At this point attention was turned to decompression. Complete laminectomy at L2 was completed, with complete radical facetectomy.  I removed the medial and superior portion of the superior articulating process at L3.  This ensured good decompression of both the traversing and exiting nerve roots.  I noted a superiorly migrated subligamentous disc herniation on the patient's left side slightly indenting the ventral surface of the exiting left L2 nerve root.  This was removed with a ball-tip dissector.  Disc space was then identified, and complete discectomy was completed bilaterally using a combination of curettes, rongeurs, and shavers.  The disc space was then packed with a combination of autograft harvested during decompression as well as BMP.  In a similar fashion, attention was turned to the L4-5 level which was again confirmed with intraoperative fluoroscopy.  Complete laminectomy was completed with a high-speed drill and Kerrison Rogers.  Radical facetectomy was then also completed.  I again removed the medial and superior portion of the superior articulating process of L5 to ensure good decompression.  There was a significant amount of scar tissue adherent to the dura on the medial surface of the right facet complex.  I therefore drilled some of the articulating process in order to identify more normal tissue, and ensure good decompression in this fashion.  I then identified the L4-5 disc space.  Disc space was incised, and complete discectomy was completed bilaterally again with a combination of curettes, Rogers, and shavers.  The disc space was again packed  with locally harvested morcellized  bone autograft and BMP.  At this point, 8 mm expandable cages were selected and tapped into place bilaterally, and expanded to 12 mm. I then placed a cortical pedicle screws under.  Lateral fluoroscopic guidance at L4-5 pedicle screws were then connected with 40 mm pre-bent lordotic rods, set screws were placed and final tightened.  In a similar fashion, I placed 8 mm expandable interbody grafts bilaterally at L2-3, and expanded them to 12 mm.  I then placed cortical pedicle screws again under lateral fluoroscopic guidance at L2 and L3.  These were also connected with 40 mm pre-bent lordotic rods, set screws were placed and final tightened.  Final AP and lateral fluoroscopic images demonstrated good position of the implanted hardware.  The wound was then irrigated with copious amounts of normal saline irrigation.  The wound was then closed in standard fashion using a combination of interrupted 0 and 3-0 Vicryl stitches in the muscular, fascial, and subcutaneous layers. Skin was then closed using standard Dermabond. Sterile dressing was then applied. The patient was then transferred to the stretcher, extubated, and taken to the postanesthesia care unit in stable hemodynamic condition.  At the end of the case all sponge, needle, cottonoid, and instrument counts were correct.

## 2017-02-18 NOTE — Evaluation (Signed)
Physical Therapy Evaluation Patient Details Name: Blenda PealsJulia C Alcalde MRN: [id number] DOB: 12/23/1940 Today's Date: 02/18/2017   History of Present Illness  patient is a 76 yo female s/p  POSTERIOR LUMBAR INTERBODY FUSION LUMBAR TWO- LUMBAR THREE, LUMBAR FOUR-LUMBAR FIVE   Clinical Impression  Patient seen for mobility assessment s/p spinal surgery. Mobilizing well. Educated patient on precautions, mobility expectations, safety and car transfers. No further acute PT needs. Will sign off.     Follow Up Recommendations No PT follow up;Supervision - Intermittent    Equipment Recommendations  None recommended by PT    Recommendations for Other Services       Precautions / Restrictions Precautions Precautions: Back Precaution Booklet Issued: Yes (comment) Required Braces or Orthoses: Spinal Brace Spinal Brace: Lumbar corset      Mobility  Bed Mobility               General bed mobility comments: received EOB  Transfers Overall transfer level: Needs assistance Equipment used: None Transfers: Sit to/from Stand Sit to Stand: Supervision         General transfer comment: supervision for safety, no physical assist required  Ambulation/Gait Ambulation/Gait assistance: Supervision Ambulation Distance (Feet): 320 Feet Assistive device: None Gait Pattern/deviations: Step-through pattern;Decreased stride length;Narrow base of support;Drifts right/left Gait velocity: decreased Gait velocity interpretation: Below normal speed for age/gender General Gait Details: Patient with modest instbaility during ambulation. VCs for increased cadence which improved stability  Stairs Stairs: Yes Stairs assistance: Supervision Stair Management: One rail Left Number of Stairs: 6 General stair comments: VCs for sequencing, no physical assist required  Wheelchair Mobility    Modified Rankin (Stroke Patients Only)       Balance Overall balance assessment: Needs assistance   Sitting  balance-Leahy Scale: Good       Standing balance-Leahy Scale: Fair                               Pertinent Vitals/Pain Pain Assessment: Faces Faces Pain Scale: Hurts even more Pain Location: Back Pain Descriptors / Indicators: Aching;Grimacing;Guarding;Operative site guarding;Sore Pain Intervention(s): Monitored during session;Limited activity within patient's tolerance;Repositioned    Home Living Family/patient expects to be discharged to:: Private residence Living Arrangements: Spouse/significant other Available Help at Discharge: Family;Available 24 hours/day Type of Home: House Home Access: Stairs to enter Entrance Stairs-Rails: Can reach both Entrance Stairs-Number of Steps: 5          Prior Function Level of Independence: Independent               Hand Dominance   Dominant Hand: Right    Extremity/Trunk Assessment   Upper Extremity Assessment Upper Extremity Assessment: Overall WFL for tasks assessed    Lower Extremity Assessment Lower Extremity Assessment: Generalized weakness    Cervical / Trunk Assessment Cervical / Trunk Assessment: Other exceptions Cervical / Trunk Exceptions: s/p decompression and fusion at L2-3 and L4-5  Communication   Communication: No difficulties  Cognition Arousal/Alertness: Lethargic Behavior During Therapy: WFL for tasks assessed/performed Overall Cognitive Status: Impaired/Different from baseline Area of Impairment: Memory;Following commands;Safety/judgement;Problem solving;Awareness                     Memory: Decreased recall of precautions;Decreased short-term memory Following Commands: Follows one step commands with increased time Safety/Judgement: Decreased awareness of safety Awareness: Emergent Problem Solving: Slow processing;Requires verbal cues;Requires tactile cues;Difficulty sequencing General Comments: Pt requiring significant amount of time to complete ADL tasks.  Requring  increased Mod-Max cues for sequencing and demonstrating decreased ST memory to remember precautions and VCs for sequencing.       General Comments      Exercises     Assessment/Plan    PT Assessment Patent does not need any further PT services  PT Problem List         PT Treatment Interventions      PT Goals (Current goals can be found in the Care Plan section)  Acute Rehab PT Goals PT Goal Formulation: All assessment and education complete, DC therapy    Frequency     Barriers to discharge        Co-evaluation               AM-PAC PT "6 Clicks" Daily Activity  Outcome Measure Difficulty turning over in bed (including adjusting bedclothes, sheets and blankets)?: A Little Difficulty moving from lying on back to sitting on the side of the bed? : A Little Difficulty sitting down on and standing up from a chair with arms (e.g., wheelchair, bedside commode, etc,.)?: A Little Help needed moving to and from a bed to chair (including a wheelchair)?: A Little Help needed walking in hospital room?: A Little Help needed climbing 3-5 steps with a railing? : A Little 6 Click Score: 18    End of Session Equipment Utilized During Treatment: Back brace Activity Tolerance: Patient tolerated treatment well Patient left: in bed;with call bell/phone within reach;with family/visitor present Nurse Communication: Mobility status PT Visit Diagnosis: Difficulty in walking, not elsewhere classified (R26.2)    Time: [id number] PT Time Calculation (min) (ACUTE ONLY): 18 min   Charges:   PT Evaluation $PT Eval Low Complexity: 1 Low     PT G Codes:   PT G-Codes **NOT FOR INPATIENT CLASS** Functional Assessment Tool Used: Clinical judgement Functional Limitation: Mobility: Walking and moving around Mobility: Walking and Moving Around Current Status ([id number]): At least 1 percent but less than 20 percent impaired, limited or restricted Mobility: Walking and Moving Around Goal Status  [phone]([id number]): At least 1 percent but less than 20 percent impaired, limited or restricted Mobility: Walking and Moving Around Discharge Status [phone]): At least 1 percent but less than 20 percent impaired, limited or restricted    Charlotte Crumbevon Tamario Heal, PT DPT  Board Certified Neurologic Specialist [phone]   Fabio AsaDevon J Hinda Lindor 02/18/2017, 9:13 AM

## 2017-02-18 NOTE — Evaluation (Signed)
Occupational Therapy Evaluation Patient Details Name: [name] [name] MRN: [id number] DOB: [birth year] Today's Date: 02/18/2017    History of Present Illness 76 yo female s/p  POSTERIOR LUMBAR INTERBODY FUSION LUMBAR TWO- LUMBAR THREE, LUMBAR FOUR-LUMBAR FIVE    Clinical Impression   PTA, pt was living with her husband and was independent. Currently, pt requires Min A for LB ADLs with AE and functional mobility. Provided education and handout on back precautions, brace management, LB ADLs with AE, toilet transfer, and tub transfer with 3N1; pt demonstrated understanding. Answered all pt questions. Recommend dc home once medically stable per physician. All acute OT needs met and will sign off. Thank you.     Follow Up Recommendations  No OT follow up;Supervision/Assistance - 24 hour    Equipment Recommendations  3 in 1 bedside commode    Recommendations for Other Services PT consult     Precautions / Restrictions Precautions Precautions: Back Precaution Booklet Issued: Yes (comment) Precaution Comments: Reviewed back precautions. Pt requiring VCs for recallling no lifting.Marland Kitchen  Required Braces or Orthoses: Spinal Brace Spinal Brace: Lumbar corset;Applied in sitting position Restrictions Weight Bearing Restrictions: No      Mobility Bed Mobility Overal bed mobility: Needs Assistance Bed Mobility: Rolling;Sidelying to Sit Rolling: Min guard Sidelying to sit: Min guard       General bed mobility comments: Provided educaiton on log roll technique. Pt requiring Mi nguard A for safety and max VCs for sequencing  Transfers Overall transfer level: Needs assistance Equipment used: None Transfers: Sit to/from Stand Sit to Stand: Min guard         General transfer comment: Min guard A for safety. VCs for tehcnique and weight shifting    Balance Overall balance assessment: Needs assistance Sitting-balance support: No upper extremity supported;Feet supported Sitting balance-Leahy  Scale: Good     Standing balance support: No upper extremity supported;During functional activity Standing balance-Leahy Scale: Fair                             ADL either performed or assessed with clinical judgement   ADL Overall ADL's : Needs assistance/impaired Eating/Feeding: Set up;Sitting   Grooming: Min guard;Standing   Upper Body Bathing: Set up;With caregiver independent assisting;Sitting   Lower Body Bathing: Minimal assistance;With adaptive equipment;With caregiver independent assisting;Sit to/from stand   Upper Body Dressing : Set up;Sitting;With caregiver independent assisting Upper Body Dressing Details (indicate cue type and reason): Pt donning shift with set up at EOB. Provided eudcation for brace managment. Husband donning back brace with VCs for sequencing and positioning. Lower Body Dressing: Minimal assistance;With adaptive equipment;Sit to/from stand Lower Body Dressing Details (indicate cue type and reason): Pt donned underwear and pants with Min A, AE, and Max VCs for sequencing. Husband confirming he will be present at [employer] Transfer: Min guard;Ambulation;BSC     Toileting - Clothing Manipulation Details (indicate cue type and reason): Discussed techniques for toilet hygiene and adherance to back precautions Tub/ Shower Transfer: Tub transfer;Minimal assistance;Cueing for sequencing;Ambulation;3 in 1 Tub/Shower Transfer Details (indicate cue type and reason): Simulated tub transfer with 3N1. Pt requiring Min A for safety and balance and Max VCs for sequencing task. Pt husband verbalizing understanding.  Functional mobility during ADLs: Minimal assistance;Cueing for safety General ADL Comments: Pt requiring Min A for LB ADLs with AE and funcitonal mobility. Pt demonstrating decreased problem solving and awarness. Pt husband very present and verbalized he will be available to assist  with all ADLs at home.     Vision Baseline Vision/History:  Wears glasses Wears Glasses: At all times Patient Visual Report: No change from baseline       Perception     Praxis      Pertinent Vitals/Pain Pain Assessment: Faces Faces Pain Scale: Hurts even more Pain Location: Back Pain Descriptors / Indicators: Aching;Grimacing;Guarding;Operative site guarding;Sore Pain Intervention(s): Monitored during session;Limited activity within patient's tolerance;Repositioned     Hand Dominance Right   Extremity/Trunk Assessment Upper Extremity Assessment Upper Extremity Assessment: Generalized weakness   Lower Extremity Assessment Lower Extremity Assessment: Generalized weakness   Cervical / Trunk Assessment Cervical / Trunk Assessment: Other exceptions Cervical / Trunk Exceptions: s/p decompression and fusion at L2-3 and L4-5   Communication Communication Communication: No difficulties   Cognition Arousal/Alertness: Lethargic Behavior During Therapy: WFL for tasks assessed/performed Overall Cognitive Status: Impaired/Different from baseline Area of Impairment: Memory;Following commands;Safety/judgement;Problem solving;Awareness                     Memory: Decreased recall of precautions;Decreased short-term memory Following Commands: Follows one step commands with increased time Safety/Judgement: Decreased awareness of safety Awareness: Emergent Problem Solving: Slow processing;Requires verbal cues;Requires tactile cues;Difficulty sequencing General Comments: Pt requiring significant amount of time to complete ADL tasks. Requring increased Mod-Max cues for sequencing and demonstrating decreased ST memory to remember precautions and VCs for sequencing.    General Comments  Husband present throughout session    Exercises     Shoulder Instructions      Home Living Family/patient expects to be discharged to:: Private residence Living Arrangements: Spouse/significant other Available Help at Discharge: Family;Available 24  hours/day Type of Home: House Home Access: Stairs to enter [employer] of Steps: 5 Entrance Stairs-Rails: Can reach both       Bathroom Shower/Tub: Tub/shower unit;Curtain   Biochemist, clinical: Standard                Prior Functioning/Environment Level of Independence: Independent                 OT Problem List: Decreased range of motion;Decreased strength;Decreased activity tolerance;Impaired balance (sitting and/or standing);Decreased safety awareness;Decreased knowledge of use of DME or AE;Decreased knowledge of precautions;Pain      OT Treatment/Interventions:      OT Goals(Current goals can be found in the care plan section) Acute Rehab OT Goals Patient Stated Goal: Go home today OT Goal Formulation: With patient Time For Goal Achievement: 03/04/17 Potential to Achieve Goals: Good  OT Frequency:     Barriers to D/C:            Co-evaluation              AM-PAC PT "6 Clicks" Daily Activity     Outcome Measure Help from another person eating meals?: None Help from another person taking care of personal grooming?: A Little Help from another person toileting, which includes using toliet, bedpan, or urinal?: A Little Help from another person bathing (including washing, rinsing, drying)?: A Lot Help from another person to put on and taking off regular upper body clothing?: A Lot Help from another person to put on and taking off regular lower body clothing?: A Lot 6 Click Score: 16   End of Session Equipment Utilized During Treatment: Gait belt;Back brace Nurse Communication: Mobility status  Activity Tolerance: Patient tolerated treatment well;Patient limited by lethargy Patient left: in chair;with call bell/phone within reach;with family/visitor present  OT Visit Diagnosis: Unsteadiness on  feet (R26.81);Other abnormalities of gait and mobility (R26.89);Muscle weakness (generalized) (M62.81);Pain Pain - Right/Left: (Back) Pain - part of  body: (Back)                Time: 8412-8208 OT Time Calculation (min): 26 min Charges:  OT General Charges $OT Visit: 1 Visit OT Evaluation $OT Eval Moderate Complexity: 1 Mod OT Treatments $Self Care/Home Management : 8-22 mins G-Codes:     [place], OTR/L Acute Rehab Pager: [phone] Office: [place] 02/18/2017, 10:31 AM

## 2017-02-18 NOTE — Plan of Care (Signed)
  Progressing Activity: Ability to avoid complications of mobility impairment will improve 02/18/2017 0543 - Progressing by Wille CelesteMadriaga-Acosta, Lakyla Biswas D, RN Ability to tolerate increased activity will improve 02/18/2017 0543 - Progressing by Wille CelesteMadriaga-Acosta, Breannah Kratt D, RN Will remain free from falls 02/18/2017 0543 - Progressing by Wille CelesteMadriaga-Acosta, Isaly Fasching D, RN Education: Ability to verbalize activity precautions or restrictions will improve 02/18/2017 0543 - Progressing by Wille CelesteMadriaga-Acosta, Galo Sayed D, RN Knowledge of the prescribed therapeutic regimen will improve 02/18/2017 0543 - Progressing by Wille CelesteMadriaga-Acosta, Jonte Shiller D, RN Understanding of discharge needs will improve 02/18/2017 0543 - Progressing by Wille CelesteMadriaga-Acosta, Jervon Ream D, RN Physical Regulation: Ability to maintain clinical measurements within normal limits will improve 02/18/2017 0543 - Progressing by Wille CelesteMadriaga-Acosta, Dayyan Krist D, RN Postoperative complications will be avoided or minimized 02/18/2017 0543 - Progressing by Wille CelesteMadriaga-Acosta, Wynn Kernes D, RN Pain Management: Pain level will decrease 02/18/2017 0543 - Progressing by Wille CelesteMadriaga-Acosta, Jullisa Grigoryan D, RN Skin Integrity: Signs of wound healing will improve 02/18/2017 0543 - Progressing by Wille CelesteMadriaga-Acosta, Iriana Artley D, RN Health Behavior/Discharge Planning: Identification of resources available to assist in meeting health care needs will improve 02/18/2017 0543 - Progressing by Wille CelesteMadriaga-Acosta, Darrielle Pflieger D, RN Bladder/Genitourinary: Urinary functional status for postoperative course will improve 02/18/2017 0543 - Progressing by Wille CelesteMadriaga-Acosta, Assyria Morreale D, RN

## 2017-02-18 NOTE — Discharge Summary (Signed)
Physician Discharge Summary  Patient ID: Blenda PealsJulia C Ribble MRN: [id number] DOB/AGE: 78/11/1940 78 y.o.  Admit date: 02/17/2017 Discharge date: 02/18/2017  Admission Diagnoses:  Spondylolisthesis L4-5 Spinal stenosis L2-3  Discharge Diagnoses:  Same Active Problems:   Spondylolisthesis at L4-L5 level   Discharged Condition: Stable  Hospital Course:  Blenda PealsJulia C Claud is a 76 y.o. female admitted after uncomplicated elective L2-3, and L4-5 PLIF. She was at neurologic baseline postop, ambulating well, tolerating diet. Pain well controlled with oral medication.  Treatments: Surgery - L2-3, L4-5 PLIF  Discharge Exam: Blood pressure (!) 103/50, pulse 87, temperature (!) 97.5 F (36.4 C), temperature source Oral, resp. rate 18, SpO2 93 %. Awake, alert, oriented Speech fluent, appropriate CN grossly intact 5/5 BUE/BLE Wound c/d/i  Disposition: Home  Discharge Instructions    Call MD for:  redness, tenderness, or signs of infection (pain, swelling, redness, odor or green/yellow discharge around incision site)   Complete by:  As directed    Call MD for:  temperature >100.4   Complete by:  As directed    Diet - low sodium heart healthy   Complete by:  As directed    Discharge instructions   Complete by:  As directed    Walk at home as much as possible, at least 4 times / day   Increase activity slowly   Complete by:  As directed    Lifting restrictions   Complete by:  As directed    No lifting > 10 lbs   May shower / Bathe   Complete by:  As directed    48 hours after surgery   May walk up steps   Complete by:  As directed    No dressing needed   Complete by:  As directed    Other Restrictions   Complete by:  As directed    No bending/twisting at waist     Allergies as of 02/18/2017   No Known Allergies     Medication List    TAKE these medications   amLODipine 5 MG tablet Commonly known as:  NORVASC TAKE 1 TABLET BY MOUTH EVERY DAY What changed:    how much to  take  how to take this  when to take this   aspirin 81 MG tablet Take 1 tablet (81 mg total) daily by mouth. Start taking on:  02/25/2017 What changed:  These instructions start on 02/25/2017. If you are unsure what to do until then, ask your doctor or other care provider.   atorvastatin 20 MG tablet Commonly known as:  LIPITOR TAKE 1 TABLET BY MOUTH EVERY DAY   CITRACAL/VITAMIN D PO Take 1 tablet by mouth daily.   ibuprofen 200 MG tablet Commonly known as:  ADVIL,MOTRIN Take 400 mg by mouth every 8 (eight) hours as needed for mild pain.   losartan-hydrochlorothiazide 100-12.5 MG tablet Commonly known as:  HYZAAR TAKE 1 TABLET BY MOUTH EVERY DAY   methocarbamol 500 MG tablet Commonly known as:  ROBAXIN Take 1 tablet (500 mg total) every 6 (six) hours as needed by mouth for muscle spasms.   oxyCODONE 5 MG immediate release tablet Commonly known as:  Oxy IR/ROXICODONE Take 1 tablet (5 mg total) every 6 (six) hours as needed by mouth for moderate pain ((score 4 to 6)).   oxyCODONE-acetaminophen 5-325 MG tablet Commonly known as:  ROXICET Take 1-2 tablets by mouth every 6 (six) hours as needed for severe pain. What changed:  additional instructions   WOMENS 50+ MULTI VITAMIN/MIN  PO Take 1 tablet by mouth daily.            Durable Medical Equipment  (From admission, onward)        Start     Ordered   02/18/17 0817  For home use only DME 3 n 1  Once     02/18/17 0816     Follow-up Information    Lisbeth RenshawNundkumar, Sachi Boulay, MD Follow up in 3 week(s).   Specialty:  Neurosurgery Contact information: [address] [place] [address] [phone]           Signed: Lisbeth RenshawUNDKUMAR, Lilyannah Zuelke, Salena SanerC 02/18/2017, 10:59 AM

## 2017-02-19 NOTE — Plan of Care (Signed)
  Progressing Education: Ability to verbalize activity precautions or restrictions will improve 02/19/2017 0618 - Progressing by Wille CelesteMadriaga-Acosta, Leonidas Boateng D, RN Knowledge of the prescribed therapeutic regimen will improve 02/19/2017 0618 - Progressing by Wille CelesteMadriaga-Acosta, Hughes Wyndham D, RN Understanding of discharge needs will improve 02/19/2017 0618 - Progressing by Wille CelesteMadriaga-Acosta, Tyronne Blann D, RN Physical Regulation: Ability to maintain clinical measurements within normal limits will improve 02/19/2017 0618 - Progressing by Wille CelesteMadriaga-Acosta, Tyiesha Brackney D, RN Postoperative complications will be avoided or minimized 02/19/2017 0618 - Progressing by Wille CelesteMadriaga-Acosta, Jaycen Vercher D, RN Health Behavior/Discharge Planning: Identification of resources available to assist in meeting health care needs will improve 02/19/2017 0618 - Progressing by Wille CelesteMadriaga-Acosta, Shreyan Hinz D, RN Bladder/Genitourinary: Urinary functional status for postoperative course will improve 02/19/2017 0618 - Progressing by Wille CelesteMadriaga-Acosta, Nashawn Hillock D, RN

## 2017-02-19 NOTE — Progress Notes (Signed)
Patient alert and oriented, mae's well, voiding adequate amount of urine, swallowing without difficulty,  c/o mild pain at time of discharge. Patient discharged home with family. Script and discharged instructions given to patient. Patient and family stated understanding of instructions given. Patient has an appointment with Dr. Nundkumar.    

## 2017-03-10 DIAGNOSIS — M48062 Spinal stenosis, lumbar region with neurogenic claudication: Secondary | ICD-10-CM | POA: Diagnosis not present

## 2017-03-16 ENCOUNTER — Encounter: Payer: Self-pay | Admitting: Family Medicine

## 2017-03-27 ENCOUNTER — Ambulatory Visit: Payer: Medicare HMO | Admitting: Family Medicine

## 2017-03-27 ENCOUNTER — Encounter: Payer: Self-pay | Admitting: Family Medicine

## 2017-03-27 VITALS — BP 128/60 | HR 106 | Temp 97.8°F | Wt 123.0 lb

## 2017-03-27 DIAGNOSIS — I10 Essential (primary) hypertension: Secondary | ICD-10-CM | POA: Diagnosis not present

## 2017-03-27 DIAGNOSIS — R600 Localized edema: Secondary | ICD-10-CM

## 2017-03-27 LAB — CBC WITH DIFFERENTIAL/PLATELET
BASOS ABS: 0 10*3/uL (ref 0.0–0.1)
Basophils Relative: 0.3 % (ref 0.0–3.0)
EOS PCT: 0.1 % (ref 0.0–5.0)
Eosinophils Absolute: 0 10*3/uL (ref 0.0–0.7)
HCT: 35.2 % — ABNORMAL LOW (ref 36.0–46.0)
HEMOGLOBIN: 11.5 g/dL — AB (ref 12.0–15.0)
LYMPHS ABS: 1.1 10*3/uL (ref 0.7–4.0)
Lymphocytes Relative: 16.6 % (ref 12.0–46.0)
MCHC: 32.8 g/dL (ref 30.0–36.0)
MCV: 94.6 fl (ref 78.0–100.0)
MONO ABS: 0.3 10*3/uL (ref 0.1–1.0)
MONOS PCT: 5.1 % (ref 3.0–12.0)
NEUTROS PCT: 77.9 % — AB (ref 43.0–77.0)
Neutro Abs: 5.4 10*3/uL (ref 1.4–7.7)
Platelets: 366 10*3/uL (ref 150.0–400.0)
RBC: 3.72 Mil/uL — AB (ref 3.87–5.11)
RDW: 14.2 % (ref 11.5–15.5)
WBC: 6.9 10*3/uL (ref 4.0–10.5)

## 2017-03-27 LAB — HEPATIC FUNCTION PANEL
ALK PHOS: 94 U/L (ref 39–117)
ALT: 13 U/L (ref 0–35)
AST: 19 U/L (ref 0–37)
Albumin: 4.3 g/dL (ref 3.5–5.2)
BILIRUBIN TOTAL: 0.6 mg/dL (ref 0.2–1.2)
Bilirubin, Direct: 0.1 mg/dL (ref 0.0–0.3)
Total Protein: 6.8 g/dL (ref 6.0–8.3)

## 2017-03-27 LAB — BASIC METABOLIC PANEL
BUN: 12 mg/dL (ref 6–23)
CALCIUM: 9.8 mg/dL (ref 8.4–10.5)
CO2: 31 mEq/L (ref 19–32)
Chloride: 97 mEq/L (ref 96–112)
Creatinine, Ser: 0.97 mg/dL (ref 0.40–1.20)
GFR: 58.95 mL/min — AB (ref >60.00)
GLUCOSE: 152 mg/dL — AB (ref 70–99)
POTASSIUM: 3.9 meq/L (ref 3.5–5.1)
SODIUM: 136 meq/L (ref 135–145)

## 2017-03-27 MED ORDER — FUROSEMIDE 20 MG PO TABS: 20.0000 mg | ORAL_TABLET | Freq: Every day | ORAL | 3 refills | Status: DC

## 2017-03-27 NOTE — Progress Notes (Signed)
Subjective:     Patient ID: [name] [name], female   DOB: 06/28/1940, 76 y.o.   MRN: [id number]  HPI Patient seen with bilateral leg edema. She had back surgery back on November 6 and went home the next day on the seventh and had some edema ever since then. No dyspnea. No orthopnea. Ambulating without much difficulty but fairly inactive. She takes 1 Advil per day. She is tapering off oxycodone. Takes amlodipine 5 mg daily for hypertension along with Hyzaar. No history of heart failure. Recent renal function normal. She had some postoperative anemia with hemoglobin 9.7. Diet is slowly improving.  No hx of CHF.  Not elevating legs much.  Past Medical History:  Diagnosis Date  . HYPERLIPIDEMIA 07/18/2008  . HYPERTENSION 07/18/2008  . OSTEOPOROSIS 07/25/2008   Past Surgical History:  Procedure Laterality Date  . LUMBAR DISC SURGERY  1998    reports that  has never smoked. she has never used smokeless tobacco. She reports that she drinks alcohol. She reports that she does not use drugs. family history includes Aneurysm in her father; Cancer in her paternal aunt and paternal grandmother; Hyperlipidemia in her mother and sister; Hypertension in her mother and sister; Stroke (age of onset: 7447) in her father. No Known Allergies   Review of Systems  Constitutional: Negative for appetite change, chills, fatigue, fever and unexpected weight change.  Eyes: Negative for visual disturbance.  Respiratory: Negative for cough, chest tightness, shortness of breath and wheezing.   Cardiovascular: Positive for leg swelling. Negative for chest pain and palpitations.  Neurological: Negative for dizziness, seizures, syncope, weakness, light-headedness and headaches.       Objective:   Physical Exam  Constitutional: She appears well-developed and well-nourished.  Neck: Neck supple.  Cardiovascular: Normal rate and regular rhythm.  Pulmonary/Chest: Effort normal and breath sounds normal. No respiratory distress.  She has no wheezes. She has no rales.  Musculoskeletal: She exhibits edema.       Assessment:     Bilateral leg edema. Suspect multifactorial. She may have some fluid overload from recent hospitalization and IV fluids. Other factors that may be contributing including recent nonsteroidal use of Advil, amlodipine use, probable component of diastolic heart failure, venous stasis.    Plan:     -Check labs with hepatic panel, basic metabolic panel, CBC -Low-dose furosemide 20 mg daily until follow-up next week to reassess -Elevate legs frequently Reduce amlodipine to 2.5 mg daily as her blood pressure is very well controlled  Kristian CoveyBruce W Marikay Roads MD [facility] -

## 2017-03-27 NOTE — Patient Instructions (Addendum)
Iron-Rich Diet Iron is a mineral that helps your body to produce hemoglobin. Hemoglobin is a protein in your red blood cells that carries oxygen to your body's tissues. Eating too little iron may cause you to feel weak and tired, and it can increase your risk for infection. Eating enough iron is necessary for your body's metabolism, muscle function, and nervous system. Iron is naturally found in many foods. It can also be added to foods or fortified in foods. There are two types of dietary iron:  Heme iron. Heme iron is absorbed by the body more easily than nonheme iron. Heme iron is found in meat, poultry, and fish.  Nonheme iron. Nonheme iron is found in dietary supplements, iron-fortified grains, beans, and vegetables.  You may need to follow an iron-rich diet if:  You have been diagnosed with iron deficiency or iron-deficiency anemia.  You have a condition that prevents you from absorbing dietary iron, such as: ? Infection in your intestines. ? Celiac disease. This involves long-lasting (chronic) inflammation of your intestines.  You do not eat enough iron.  You eat a diet that is high in foods that impair iron absorption.  You have lost a lot of blood.  You have heavy bleeding during your menstrual cycle.  You are pregnant.  What is my plan? Your health care provider may help you to determine how much iron you need per day based on your condition. Generally, when a person consumes sufficient amounts of iron in the diet, the following iron needs are met:  Men. ? 14-18 years old: 11 mg per day. ? 19-50 years old: 8 mg per day.  Women. ? 14-18 years old: 15 mg per day. ? 19-50 years old: 18 mg per day. ? Over 50 years old: 8 mg per day. ? Pregnant women: 27 mg per day. ? Breastfeeding women: 9 mg per day.  What do I need to know about an iron-rich diet?  Eat fresh fruits and vegetables that are high in vitamin C along with foods that are high in iron. This will help  increase the amount of iron that your body absorbs from food, especially with foods containing nonheme iron. Foods that are high in vitamin C include oranges, peppers, tomatoes, and mango.  Take iron supplements only as directed by your health care provider. Overdose of iron can be life-threatening. If you were prescribed iron supplements, take them with orange juice or a vitamin C supplement.  Cook foods in pots and pans that are made from iron.  Eat nonheme iron-containing foods alongside foods that are high in heme iron. This helps to improve your iron absorption.  Certain foods and drinks contain compounds that impair iron absorption. Avoid eating these foods in the same meal as iron-rich foods or with iron supplements. These include: ? Coffee, black tea, and red wine. ? Milk, dairy products, and foods that are high in calcium. ? Beans, soybeans, and peas. ? Whole grains.  When eating foods that contain both nonheme iron and compounds that impair iron absorption, follow these tips to absorb iron better. ? Soak beans overnight before cooking. ? Soak whole grains overnight and drain them before using. ? Ferment flours before baking, such as using yeast in bread dough. What foods can I eat? Grains Iron-fortified breakfast cereal. Iron-fortified whole-wheat bread. Enriched rice. Sprouted grains. Vegetables Spinach. Potatoes with skin. Green peas. Broccoli. Red and green bell peppers. Fermented vegetables. Fruits Prunes. Raisins. Oranges. Strawberries. Mango. Grapefruit. Meats and Other Protein Sources   Beef liver. Oysters. Beef. Shrimp. [place]. Chicken. [place]. Sardines. Chickpeas. Nuts. Tofu. Beverages Tomato juice. Fresh orange juice. Prune juice. Hibiscus tea. Fortified instant breakfast shakes. Condiments Tahini. Fermented soy sauce. Sweets and Desserts Black-strap molasses. Other Wheat germ. The items listed above may not be a complete list of recommended foods or beverages.  Contact your dietitian for more options. What foods are not recommended? Grains Whole grains. Bran cereal. Bran flour. Oats. Vegetables Artichokes. Brussels sprouts. Kale. Fruits Blueberries. Raspberries. Strawberries. Figs. Meats and Other Protein Sources Soybeans. Products made from soy protein. Dairy Milk. Cream. Cheese. Yogurt. Cottage cheese. Beverages Coffee. Black tea. Red wine. Sweets and Desserts Cocoa. Chocolate. Ice cream. Other Basil. Oregano. Parsley. The items listed above may not be a complete list of foods and beverages to avoid. Contact your dietitian for more information. This information is not intended to replace advice given to you by your health care provider. Make sure you discuss any questions you have with your health care provider. Document Released: 11/13/2004 Document Revised: 10/20/2015 Document Reviewed: 10/27/2013 Elsevier Interactive Patient Education  2018 [employer] [place].  Reduce the Amlodipine to 2.5 mg once daily until follow up.

## 2017-04-02 ENCOUNTER — Ambulatory Visit: Payer: Medicare HMO | Admitting: Family Medicine

## 2017-04-02 ENCOUNTER — Encounter: Payer: Self-pay | Admitting: Family Medicine

## 2017-04-02 VITALS — BP 134/70 | HR 88 | Temp 97.8°F | Wt 122.5 lb

## 2017-04-02 DIAGNOSIS — I10 Essential (primary) hypertension: Secondary | ICD-10-CM | POA: Diagnosis not present

## 2017-04-02 DIAGNOSIS — R600 Localized edema: Secondary | ICD-10-CM

## 2017-04-02 NOTE — Patient Instructions (Signed)
Increase the Furosemide to 40 mg daily for 3 days and then drop back to 20 mg daily  Keep the Amlodipine at 2.5 mg daily

## 2017-04-02 NOTE — Progress Notes (Signed)
Subjective:     Patient ID: [name] [name], female   DOB: 08/16/1940, 76 y.o.   MRN: [id number]  HPI Patient seen for follow-up regarding bilateral ankle and leg edema. We reduced her amlodipine to 2.5 mg and started Lasix 20 g daily. She thinks she seen some very mild improvement. Edema is worse late in the day. No dyspnea. No orthopnea. She's not using nonsteroidals regularly other than occasional Advil 200 milligrams once or twice daily  Recent labs-BMP, albumin normal.  hgb improving.  Past Medical History:  Diagnosis Date  . HYPERLIPIDEMIA 07/18/2008  . HYPERTENSION 07/18/2008  . OSTEOPOROSIS 07/25/2008   Past Surgical History:  Procedure Laterality Date  . LUMBAR DISC SURGERY  1998    reports that  has never smoked. she has never used smokeless tobacco. She reports that she drinks alcohol. She reports that she does not use drugs. family history includes Aneurysm in her father; Cancer in her paternal aunt and paternal grandmother; Hyperlipidemia in her mother and sister; Hypertension in her mother and sister; Stroke (age of onset: 3547) in her father. No Known Allergies   Review of Systems  Constitutional: Negative for fatigue and unexpected weight change.  Eyes: Negative for visual disturbance.  Respiratory: Negative for cough, chest tightness, shortness of breath and wheezing.   Cardiovascular: Positive for leg swelling. Negative for chest pain and palpitations.  Neurological: Negative for dizziness, seizures, syncope, weakness, light-headedness and headaches.       Objective:   Physical Exam  Constitutional: She appears well-developed and well-nourished.  Cardiovascular: Normal rate and regular rhythm.  Pulmonary/Chest: Effort normal and breath sounds normal. No respiratory distress. She has no wheezes. She has no rales.  Musculoskeletal: She exhibits edema.  Still has trace to 1+ edema ankles and lower legs bilaterally. Left is slightly worse than right       Assessment:      Bilateral leg edema. Suspect multifactorial    Plan:     -Continue reduced dose of amlodipine 2.5 mg daily as blood pressure has remained stable on reduction dose -Bump furosemide up to 40 mg daily for 3 days then drop back to lower dose -Taper off furosemide completely if edema improved in one week -Consider support stockings to help control edema -avoid regular use of NSAIDS.  Kristian CoveyBruce W Breion Novacek MD [facility]

## 2017-04-28 ENCOUNTER — Ambulatory Visit (INDEPENDENT_AMBULATORY_CARE_PROVIDER_SITE_OTHER): Payer: Medicare HMO | Admitting: Family Medicine

## 2017-04-28 ENCOUNTER — Encounter: Payer: Self-pay | Admitting: Family Medicine

## 2017-04-28 VITALS — BP 130/70 | HR 88 | Temp 97.7°F | Wt 121.6 lb

## 2017-04-28 DIAGNOSIS — I10 Essential (primary) hypertension: Secondary | ICD-10-CM | POA: Diagnosis not present

## 2017-04-28 MED ORDER — AMLODIPINE BESYLATE 2.5 MG PO TABS: 2.5000 mg | ORAL_TABLET | Freq: Every day | ORAL | 3 refills | Status: DC

## 2017-04-28 NOTE — Patient Instructions (Signed)
Make sure you are fasting for follow up.

## 2017-04-28 NOTE — Progress Notes (Signed)
Subjective:     Patient ID: [name] [name], female   DOB: 03/14/1941, 77 y.o.   MRN: [id number]  HPI Patient here to follow-up hypertension recent bilateral leg edema. We reduced her amlodipine to 2.5 mg and treated her briefly with Lasix. Her edema has essentially resolved at this time. She thinks in retrospect that her blood pressure is up related to her back pain. Her back is recovering nicely and much less pain at this time. She is completely off Lasix. Remains on Hyzaar and amlodipine. No recent headaches or dizziness. No chest pains.  Past Medical History:  Diagnosis Date  . HYPERLIPIDEMIA 07/18/2008  . HYPERTENSION 07/18/2008  . OSTEOPOROSIS 07/25/2008   Past Surgical History:  Procedure Laterality Date  . LUMBAR DISC SURGERY  1998    reports that  has never smoked. she has never used smokeless tobacco. She reports that she drinks alcohol. She reports that she does not use drugs. family history includes Aneurysm in her father; Cancer in her paternal aunt and paternal grandmother; Hyperlipidemia in her mother and sister; Hypertension in her mother and sister; Stroke (age of onset: 5747) in her father. No Known Allergies   Review of Systems  Constitutional: Negative for fatigue and unexpected weight change.  Eyes: Negative for visual disturbance.  Respiratory: Negative for cough, chest tightness, shortness of breath and wheezing.   Cardiovascular: Negative for chest pain, palpitations and leg swelling.  Endocrine: Negative for polydipsia and polyuria.  Neurological: Negative for dizziness, seizures, syncope, weakness, light-headedness and headaches.       Objective:   Physical Exam  Constitutional: She appears well-developed and well-nourished.  Eyes: Pupils are equal, round, and reactive to light.  Neck: Neck supple. No JVD present. No thyromegaly present.  Cardiovascular: Normal rate and regular rhythm. Exam reveals no gallop.  Pulmonary/Chest: Effort normal and breath sounds  normal. No respiratory distress. She has no wheezes. She has no rales.  Musculoskeletal: She exhibits no edema.  Neurological: She is alert.       Assessment:     #1 recent bilateral leg edema resolved  #2 hypertension stable and at goal    Plan:     -Continue current medications. Continue to monitor blood pressure and be in touch if consistently greater than 140/90 -Routine follow-up in 6 months and obtain fasting labs including lipids then  Kristian CoveyBruce W Sanel Stemmer MD [name]

## 2017-06-15 ENCOUNTER — Encounter: Payer: Self-pay | Admitting: Family Medicine

## 2017-06-21 ENCOUNTER — Other Ambulatory Visit: Payer: Self-pay | Admitting: Family Medicine

## 2017-07-11 ENCOUNTER — Other Ambulatory Visit: Payer: Self-pay | Admitting: Family Medicine

## 2017-09-17 ENCOUNTER — Other Ambulatory Visit: Payer: Self-pay | Admitting: Family Medicine

## 2017-10-28 ENCOUNTER — Ambulatory Visit (INDEPENDENT_AMBULATORY_CARE_PROVIDER_SITE_OTHER): Payer: Medicare HMO | Admitting: Family Medicine

## 2017-10-28 ENCOUNTER — Encounter: Payer: Self-pay | Admitting: Family Medicine

## 2017-10-28 VITALS — BP 140/84 | HR 86 | Temp 98.0°F | Wt 126.3 lb

## 2017-10-28 DIAGNOSIS — I10 Essential (primary) hypertension: Secondary | ICD-10-CM

## 2017-10-28 DIAGNOSIS — M810 Age-related osteoporosis without current pathological fracture: Secondary | ICD-10-CM | POA: Diagnosis not present

## 2017-10-28 DIAGNOSIS — E785 Hyperlipidemia, unspecified: Secondary | ICD-10-CM | POA: Diagnosis not present

## 2017-10-28 LAB — BASIC METABOLIC PANEL
BUN: 16 mg/dL (ref 6–23)
CALCIUM: 10.5 mg/dL (ref 8.4–10.5)
CO2: 33 mEq/L — ABNORMAL HIGH (ref 19–32)
CREATININE: 1.04 mg/dL (ref 0.40–1.20)
Chloride: 97 mEq/L (ref 96–112)
GFR: 54.32 mL/min — AB (ref >60.00)
Glucose, Bld: 120 mg/dL — ABNORMAL HIGH (ref 70–99)
Potassium: 4.7 mEq/L (ref 3.5–5.1)
SODIUM: 139 meq/L (ref 135–145)

## 2017-10-28 LAB — LIPID PANEL
CHOLESTEROL: 244 mg/dL — AB (ref 0–200)
HDL: 95.6 mg/dL (ref >39.00)
LDL Cholesterol: 126 mg/dL — ABNORMAL HIGH (ref 0–99)
NonHDL: 148.78
TRIGLYCERIDES: 113 mg/dL (ref 0.0–149.0)
Total CHOL/HDL Ratio: 3
VLDL: 22.6 mg/dL (ref 0.0–40.0)

## 2017-10-28 LAB — HEPATIC FUNCTION PANEL
ALBUMIN: 4.6 g/dL (ref 3.5–5.2)
ALK PHOS: 80 U/L (ref 39–117)
ALT: 14 U/L (ref 0–35)
AST: 21 U/L (ref 0–37)
BILIRUBIN DIRECT: 0.1 mg/dL (ref 0.0–0.3)
TOTAL PROTEIN: 7.2 g/dL (ref 6.0–8.3)
Total Bilirubin: 0.7 mg/dL (ref 0.2–1.2)

## 2017-10-28 NOTE — Progress Notes (Signed)
  Subjective:     Patient ID: [name] [name], female   DOB: 12/04/1940, 77 y.o.   MRN: [id number]  HPI Her for medical follow-up. We discussed the following issues  Hypertension. Currently treated with amlodipine 2.5 mg daily and losartan HCTZ 100/12.5 mg 1 daily. Home blood pressures reviewed. Only rare reading over 140 systolic but mostly low 100s systolic and diastolics consistently in the 60s and 70s. No dizziness. No headache. No chest pains. No peripheral edema.  Hyperlipidemia on Lipitor 20 mg daily. No myalgias. No family history of premature CAD. Her father died of cerebral aneurysm age 79. Patient takes aspirin 81 mg daily.  History of osteoporosis. She walks regularly for exercise. Takes calcium and vitamin D. Has taken Fosamax previously. Declines any further medical therapies at this time. Declines follow-up DEXA scan  Past Medical History:  Diagnosis Date  . HYPERLIPIDEMIA 07/18/2008  . HYPERTENSION 07/18/2008  . OSTEOPOROSIS 07/25/2008   Past Surgical History:  Procedure Laterality Date  . LUMBAR DISC SURGERY  1998    reports that she has never smoked. She has never used smokeless tobacco. She reports that she drinks alcohol. She reports that she does not use drugs. family history includes Aneurysm in her father; Cancer in her paternal aunt and paternal grandmother; Hyperlipidemia in her mother and sister; Hypertension in her mother and sister; Stroke (age of onset: 2947) in her father. No Known Allergies   Review of Systems  Constitutional: Negative for fatigue.  Eyes: Negative for visual disturbance.  Respiratory: Negative for cough, chest tightness, shortness of breath and wheezing.   Cardiovascular: Negative for chest pain, palpitations and leg swelling.  Neurological: Negative for dizziness, seizures, syncope, weakness, light-headedness and headaches.       Objective:   Physical Exam  Constitutional: She appears well-developed and well-nourished.  Eyes: Pupils are  equal, round, and reactive to light.  Neck: Neck supple. No JVD present. No thyromegaly present.  Cardiovascular: Normal rate and regular rhythm. Exam reveals no gallop.  Pulmonary/Chest: Effort normal and breath sounds normal. No respiratory distress. She has no wheezes. She has no rales.  Musculoskeletal: She exhibits no edema.  Neurological: She is alert.       Assessment:     #1 hypertension stable by multiple home readings  #2 hyperlipidemia on low-dose Lipitor  #3 history of osteoporosis    Plan:     -Check labs with basic metabolic panel, hepatic panel, lipid panel -Continue current medications -Continue regular weightbearing exercise -Offered follow-up DEXA scan and patient declines. We discussed osteoporosis treatment further and she declines any further medical treatment at this time -Routine follow-up in 6 months and sooner as needed  Kristian CoveyBruce W Tobi Groesbeck MD [facility]

## 2017-10-28 NOTE — Patient Instructions (Addendum)
Stop the aspirin  We will call you with labs done today.    I would like for you you to schedule a Medicare Annual Wellness Visit (AWV).   This is a yearly appointment with our Health Coach Montine Circle(Susan Hauck, RN) and is designed to develop a personalized prevention plan. This is not a head to toe physical, but rather an opportunity to prevent illness based on your current health and risk factors for disease.   Visits usually last 30-60 minutes and include various screenings for hearing, vision, depression, and dementia, falls, and safety concerns. The visit also includes diet and exercise counseling and information about advance directives.   This is also an opportunity to discuss appropriate health maintenance testing such as mammography, colonoscopy, lung cancer screening, and hepatitis C testing.   The AWV is fully covered by Medicare Part B if:  . You have had Part B for over 12 months, AND . You have not had an AWV in the past 12 months .  Please don't miss out on this opportunity! Set up your appointment today!

## 2017-12-19 ENCOUNTER — Other Ambulatory Visit: Payer: Self-pay | Admitting: Family Medicine

## 2018-01-08 ENCOUNTER — Other Ambulatory Visit: Payer: Self-pay | Admitting: Family Medicine

## 2018-01-19 ENCOUNTER — Other Ambulatory Visit: Payer: Self-pay | Admitting: Family Medicine

## 2018-01-21 DIAGNOSIS — M48062 Spinal stenosis, lumbar region with neurogenic claudication: Secondary | ICD-10-CM | POA: Diagnosis not present

## 2018-01-21 DIAGNOSIS — M4316 Spondylolisthesis, lumbar region: Secondary | ICD-10-CM | POA: Diagnosis not present

## 2018-02-27 ENCOUNTER — Encounter: Payer: Self-pay | Admitting: Family Medicine

## 2018-03-02 ENCOUNTER — Encounter: Payer: Self-pay | Admitting: Family Medicine

## 2018-03-02 ENCOUNTER — Ambulatory Visit (INDEPENDENT_AMBULATORY_CARE_PROVIDER_SITE_OTHER): Payer: Medicare HMO | Admitting: Family Medicine

## 2018-03-02 ENCOUNTER — Other Ambulatory Visit: Payer: Self-pay

## 2018-03-02 VITALS — BP 140/80 | HR 86 | Temp 98.0°F | Wt 128.9 lb

## 2018-03-02 DIAGNOSIS — I10 Essential (primary) hypertension: Secondary | ICD-10-CM | POA: Diagnosis not present

## 2018-03-02 DIAGNOSIS — R202 Paresthesia of skin: Secondary | ICD-10-CM

## 2018-03-02 NOTE — Patient Instructions (Signed)
Carpal Tunnel Syndrome Carpal tunnel syndrome is a condition that causes pain in your hand and arm. The carpal tunnel is a narrow area located on the palm side of your wrist. Repeated wrist motion or certain diseases may cause swelling within the tunnel. This swelling pinches the main nerve in the wrist (median nerve). What are the causes? This condition may be caused by:  Repeated wrist motions.  Wrist injuries.  Arthritis.  A cyst or tumor in the carpal tunnel.  Fluid buildup during pregnancy.  Sometimes the cause of this condition is not known. What increases the risk? This condition is more likely to develop in:  People who have jobs that cause them to repeatedly move their wrists in the same motion, such as Health visitorbutchers and cashiers.  Women.  People with certain conditions, such as: ? Diabetes. ? Obesity. ? An underactive thyroid (hypothyroidism). ? Kidney failure.  What are the signs or symptoms? Symptoms of this condition include:  A tingling feeling in your fingers, especially in your thumb, index, and middle fingers.  Tingling or numbness in your hand.  An aching feeling in your entire arm, especially when your wrist and elbow are bent for long periods of time.  Wrist pain that goes up your arm to your shoulder.  Pain that goes down into your palm or fingers.  A weak feeling in your hands. You may have trouble grabbing and holding items.  Your symptoms may feel worse during the night. How is this diagnosed? This condition is diagnosed with a medical history and physical exam. You may also have tests, including:  An electromyogram (EMG). This test measures electrical signals sent by your nerves into the muscles.  X-rays.  How is this treated? Treatment for this condition includes:  Lifestyle changes. It is important to stop doing or modify the activity that caused your condition.  Physical or occupational therapy.  Medicines for pain and inflammation.  This may include medicine that is injected into your wrist.  A wrist splint.  Surgery.  Follow these instructions at home: If you have a splint:  Wear it as told by your health care provider. Remove it only as told by your health care provider.  Loosen the splint if your fingers become numb and tingle, or if they turn cold and blue.  Keep the splint clean and dry. General instructions  Take over-the-counter and prescription medicines only as told by your health care provider.  Rest your wrist from any activity that may be causing your pain. If your condition is work related, talk to your employer about changes that can be made, such as getting a wrist pad to use while typing.  If directed, apply ice to the painful area: ? Put ice in a plastic bag. ? Place a towel between your skin and the bag. ? Leave the ice on for 20 minutes, 2-3 times per day.  Keep all follow-up visits as told by your health care provider. This is important.  Do any exercises as told by your health care provider, physical therapist, or occupational therapist. Contact a health care provider if:  You have new symptoms.  Your pain is not controlled with medicines.  Your symptoms get worse. This information is not intended to replace advice given to you by your health care provider. Make sure you discuss any questions you have with your health care provider. Document Released: 03/29/2000 Document Revised: 08/10/2015 Document Reviewed: 08/17/2014 Elsevier Interactive Patient Education  2018 [employer]  Try short  velcro wrist splint and wear nightly and let me know if symptoms not improving in 3-4 weeks.

## 2018-03-02 NOTE — Progress Notes (Signed)
  Subjective:     Patient ID: [name] PealsJulia C Doswell, female   DOB: 11/10/1940, 77 y.o.   MRN: [id number]  HPI Patient seen with new complaint which is some tingling and pain right hand.  Duration about 3 to 4 weeks.  Denies any injury.  No neck pain.  Distribution mostly first through third digits.  Sparing of the fifth digit.  No history of carpal tunnel syndrome.  No alleviating factors.  Symptoms have been slightly improved past couple of days.  Not aware of any weakness.  No left hand symptoms.  No lower extremity symptoms.  This is a new problem.  Denies any overuse activities recently  Hypertension treated with losartan HCTZ and amlodipine.  Blood pressures have been very well controlled by home readings.  Suspected whitecoat syndrome in the past.  Compliant with therapy  Past Medical History:  Diagnosis Date  . HYPERLIPIDEMIA 07/18/2008  . HYPERTENSION 07/18/2008  . OSTEOPOROSIS 07/25/2008   Past Surgical History:  Procedure Laterality Date  . LUMBAR DISC SURGERY  1998    reports that she has never smoked. She has never used smokeless tobacco. She reports that she drinks alcohol. She reports that she does not use drugs. family history includes Aneurysm in her father; Cancer in her paternal aunt and paternal grandmother; Hyperlipidemia in her mother and sister; Hypertension in her mother and sister; Stroke (age of onset: 7247) in her father. No Known Allergies   Review of Systems  Constitutional: Negative for fatigue.  Eyes: Negative for visual disturbance.  Respiratory: Negative for cough, chest tightness, shortness of breath and wheezing.   Cardiovascular: Negative for chest pain, palpitations and leg swelling.  Neurological: Positive for numbness. Negative for dizziness, tremors, seizures, syncope, speech difficulty, weakness, light-headedness and headaches.       Objective:   Physical Exam  Constitutional: She appears well-developed and well-nourished.  Eyes: Pupils are equal, round, and  reactive to light.  Neck: Neck supple. No JVD present. No thyromegaly present.  Cardiovascular: Normal rate and regular rhythm. Exam reveals no gallop.  Pulmonary/Chest: Effort normal and breath sounds normal. No respiratory distress. She has no wheezes. She has no rales.  Musculoskeletal: She exhibits no edema.  Right hand examined.  She may have some very mild right thenar muscle atrophy compared to the left.  She is right-hand dominant.  She has full strength though throughout.  Symmetric upper extremity reflexes.  Normal sensory function throughout.  Neurological: She is alert.       Assessment:     #1 hypertension.  Past history of probable whitecoat syndrome.  Blood pressure did improve some after rest down to 140/80.  Continue close monitoring at home.  #2 paresthesias right hand.  Suspect carpal tunnel syndrome.  Doubt cervical nerve root impingement    Plan:     -Recommend trial of Velcro wrist splint on the right to wear nightly for the next few weeks and as much as possible during the day.  Touch base if symptoms not resolving in 3 to 4 weeks -Consider nerve conduction velocities if not improved in 3 to 4 weeks -Continue close home monitoring of blood pressure and be in touch if consistently greater than 140/90.  Kristian CoveyBruce W Kentrail Shew MD [facility]

## 2018-03-17 ENCOUNTER — Other Ambulatory Visit: Payer: Self-pay

## 2018-03-17 ENCOUNTER — Encounter: Payer: Self-pay | Admitting: Family Medicine

## 2018-03-17 ENCOUNTER — Ambulatory Visit (INDEPENDENT_AMBULATORY_CARE_PROVIDER_SITE_OTHER): Payer: Medicare HMO | Admitting: Family Medicine

## 2018-03-17 VITALS — BP 144/78 | HR 92 | Temp 97.9°F | Wt 130.0 lb

## 2018-03-17 DIAGNOSIS — G5601 Carpal tunnel syndrome, right upper limb: Secondary | ICD-10-CM | POA: Diagnosis not present

## 2018-03-17 NOTE — Progress Notes (Signed)
  Subjective:     Patient ID: [name] [name], female   DOB: 02/01/1941, 77 y.o.   MRN: [id number]  HPI Patient is seen with continued right wrist and hand pain and numbness involving mostly first through fourth digits of the hand.  We suspected carpal tunnel syndrome.  She has been wearing wrist splint day and night without much improvement.  Her pain is described as a burning pain is worse at night.  She has taken some Advil with minimal relief.  She has not had any weakness but continues to have some numbness mostly involving several fingertips.  No neck pain.  No right arm pain.  Past Medical History:  Diagnosis Date  . HYPERLIPIDEMIA 07/18/2008  . HYPERTENSION 07/18/2008  . OSTEOPOROSIS 07/25/2008   Past Surgical History:  Procedure Laterality Date  . LUMBAR DISC SURGERY  1998    reports that she has never smoked. She has never used smokeless tobacco. She reports that she drinks alcohol. She reports that she does not use drugs. family history includes Aneurysm in her father; Cancer in her paternal aunt and paternal grandmother; Hyperlipidemia in her mother and sister; Hypertension in her mother and sister; Stroke (age of onset: 4147) in her father. No Known Allergies   Review of Systems  Musculoskeletal: Negative for neck pain.  Neurological: Positive for numbness. Negative for weakness.       Objective:   Physical Exam  Constitutional: She appears well-developed and well-nourished.  Cardiovascular: Normal rate and regular rhythm.  Musculoskeletal:  Right hand and wrist reveal no edema.  No localized tenderness.       Assessment:     Probable right carpal tunnel syndrome    Plan:     -Set up referral to hand surgeon for further evaluation -Continue nighttime use of wrist splint  Kristian CoveyBruce W Butler Vegh MD [facility]

## 2018-03-17 NOTE — Patient Instructions (Signed)
Continue with wrist splint at night  We are setting up orthopedic referral.

## 2018-04-10 DIAGNOSIS — G5601 Carpal tunnel syndrome, right upper limb: Secondary | ICD-10-CM | POA: Diagnosis not present

## 2018-04-18 DIAGNOSIS — G5601 Carpal tunnel syndrome, right upper limb: Secondary | ICD-10-CM | POA: Diagnosis not present

## 2018-04-28 DIAGNOSIS — G5601 Carpal tunnel syndrome, right upper limb: Secondary | ICD-10-CM | POA: Diagnosis not present

## 2018-05-01 ENCOUNTER — Ambulatory Visit (INDEPENDENT_AMBULATORY_CARE_PROVIDER_SITE_OTHER): Payer: Medicare HMO | Admitting: Family Medicine

## 2018-05-01 ENCOUNTER — Encounter: Payer: Self-pay | Admitting: Family Medicine

## 2018-05-01 ENCOUNTER — Other Ambulatory Visit: Payer: Self-pay

## 2018-05-01 VITALS — BP 124/68 | HR 73 | Temp 98.1°F | Wt 127.9 lb

## 2018-05-01 DIAGNOSIS — G5602 Carpal tunnel syndrome, left upper limb: Secondary | ICD-10-CM | POA: Insufficient documentation

## 2018-05-01 DIAGNOSIS — G4700 Insomnia, unspecified: Secondary | ICD-10-CM | POA: Diagnosis not present

## 2018-05-01 DIAGNOSIS — I10 Essential (primary) hypertension: Secondary | ICD-10-CM | POA: Diagnosis not present

## 2018-05-01 MED ORDER — GABAPENTIN 100 MG PO CAPS: ORAL_CAPSULE | ORAL | 1 refills | Status: DC

## 2018-05-01 NOTE — Patient Instructions (Addendum)
Take one capsule at night and may titrate up by another capsule every few days up to top dose of 300 mg (if needed).  Monitor blood pressure and if consistently > 140/90 increase the Amlodipine to 5 mg daily.

## 2018-05-01 NOTE — Progress Notes (Signed)
  Subjective:     Patient ID: MARIELLEN HECIMOVICH, female   DOB: 19-Jan-1941, 78 y.o.   MRN: [id number]  HPI Patient is seen for the following issues  Hypertension.  She is currently on a regimen of losartan HCTZ and amlodipine 2.5 mg daily.  Home blood pressures mostly 120s systolic with occasional run 140.  She has suspected whitecoat syndrome.  No regular alcohol use.  No headaches or dizziness.  Generally feels well.  Compliant with therapy.  Second issue is recent left carpal tunnel syndrome.  She has seen hand surgeon and is scheduled for surgery in February.  She did not respond to splinting.  She is having significant problems with insomnia mostly related to her pain.  This is even with splinting.  No relief with over-the-counter medications.  She is requesting possible medication options to help until her surgery.  Past Medical History:  Diagnosis Date  . HYPERLIPIDEMIA 07/18/2008  . HYPERTENSION 07/18/2008  . OSTEOPOROSIS 07/25/2008   Past Surgical History:  Procedure Laterality Date  . LUMBAR DISC SURGERY  1998    reports that she has never smoked. She has never used smokeless tobacco. She reports current alcohol use. She reports that she does not use drugs. family history includes Aneurysm in her father; Cancer in her paternal aunt and paternal grandmother; Hyperlipidemia in her mother and sister; Hypertension in her mother and sister; Stroke (age of onset: 6) in her father. No Known Allergies   Review of Systems  Constitutional: Negative for fatigue.  Eyes: Negative for visual disturbance.  Respiratory: Negative for cough, chest tightness, shortness of breath and wheezing.   Cardiovascular: Negative for chest pain, palpitations and leg swelling.  Neurological: Negative for dizziness, seizures, syncope, weakness, light-headedness and headaches.  Psychiatric/Behavioral: Positive for sleep disturbance.       Objective:   Physical Exam Constitutional:      Appearance: She is  well-developed.  Eyes:     Pupils: Pupils are equal, round, and reactive to light.  Neck:     Musculoskeletal: Neck supple.     Thyroid: No thyromegaly.     Vascular: No JVD.  Cardiovascular:     Rate and Rhythm: Normal rate and regular rhythm.     Heart sounds: No gallop.   Pulmonary:     Effort: Pulmonary effort is normal. No respiratory distress.     Breath sounds: Normal breath sounds. No wheezing or rales.  Neurological:     Mental Status: She is alert.        Assessment:     #1 hypertension.  Probable whitecoat syndrome.  Blood pressure improved today  #2 carpal tunnel syndrome left wrist unimproved with conservative measures with scheduled surgery as above  #3 insomnia probably related to #2    Plan:     -Continue to monitor blood pressure and be in touch of consistent readings greater than 140/90 -We wrote for gabapentin 100 mg to try 1 at night and may slowly titrate up to dose of 300 mg at night if needed and hopefully this will be short-term until her surgery -Routine follow-up in 6 months and repeat lipids at that point  Kristian Covey MD [facility] at [facility]

## 2018-05-16 HISTORY — PX: CARPAL TUNNEL RELEASE: SHX101

## 2018-05-18 DIAGNOSIS — G5601 Carpal tunnel syndrome, right upper limb: Secondary | ICD-10-CM | POA: Diagnosis not present

## 2018-05-29 DIAGNOSIS — Z4789 Encounter for other orthopedic aftercare: Secondary | ICD-10-CM | POA: Diagnosis not present

## 2018-05-29 DIAGNOSIS — G5601 Carpal tunnel syndrome, right upper limb: Secondary | ICD-10-CM | POA: Diagnosis not present

## 2018-06-01 ENCOUNTER — Encounter: Payer: Self-pay | Admitting: Family Medicine

## 2018-06-02 ENCOUNTER — Other Ambulatory Visit: Payer: Self-pay

## 2018-06-02 MED ORDER — AMLODIPINE BESYLATE 5 MG PO TABS: 5.0000 mg | ORAL_TABLET | Freq: Every day | ORAL | 3 refills | Status: DC

## 2018-07-09 ENCOUNTER — Other Ambulatory Visit: Payer: Self-pay | Admitting: Family Medicine

## 2018-07-31 ENCOUNTER — Telehealth: Payer: Self-pay | Admitting: *Deleted

## 2018-07-31 NOTE — Telephone Encounter (Signed)
Pt called back, she is not interested in awv

## 2018-07-31 NOTE — Telephone Encounter (Signed)
Called to schedule awv message left  

## 2018-09-10 ENCOUNTER — Other Ambulatory Visit: Payer: Self-pay | Admitting: Family Medicine

## 2018-10-30 ENCOUNTER — Encounter: Payer: Self-pay | Admitting: Family Medicine

## 2018-10-30 ENCOUNTER — Ambulatory Visit (INDEPENDENT_AMBULATORY_CARE_PROVIDER_SITE_OTHER): Payer: Medicare HMO | Admitting: Family Medicine

## 2018-10-30 ENCOUNTER — Other Ambulatory Visit: Payer: Self-pay

## 2018-10-30 VITALS — BP 126/82 | HR 92 | Temp 97.6°F | Ht 63.25 in | Wt 128.0 lb

## 2018-10-30 DIAGNOSIS — I10 Essential (primary) hypertension: Secondary | ICD-10-CM | POA: Diagnosis not present

## 2018-10-30 DIAGNOSIS — E785 Hyperlipidemia, unspecified: Secondary | ICD-10-CM

## 2018-10-30 DIAGNOSIS — Z0000 Encounter for general adult medical examination without abnormal findings: Secondary | ICD-10-CM

## 2018-10-30 DIAGNOSIS — R739 Hyperglycemia, unspecified: Secondary | ICD-10-CM | POA: Diagnosis not present

## 2018-10-30 LAB — HEPATIC FUNCTION PANEL
ALT: 13 U/L (ref 0–35)
AST: 16 U/L (ref 0–37)
Albumin: 4.7 g/dL (ref 3.5–5.2)
Alkaline Phosphatase: 75 U/L (ref 39–117)
Bilirubin, Direct: 0.1 mg/dL (ref 0.0–0.3)
Total Bilirubin: 0.8 mg/dL (ref 0.2–1.2)
Total Protein: 7 g/dL (ref 6.0–8.3)

## 2018-10-30 LAB — BASIC METABOLIC PANEL
BUN: 20 mg/dL (ref 6–23)
CO2: 31 mEq/L (ref 19–32)
Calcium: 10 mg/dL (ref 8.4–10.5)
Chloride: 98 mEq/L (ref 96–112)
Creatinine, Ser: 1.18 mg/dL (ref 0.40–1.20)
GFR: 44.06 mL/min — ABNORMAL LOW (ref >60.00)
Glucose, Bld: 112 mg/dL — ABNORMAL HIGH (ref 70–99)
Potassium: 4.2 mEq/L (ref 3.5–5.1)
Sodium: 138 mEq/L (ref 135–145)

## 2018-10-30 LAB — LIPID PANEL
Cholesterol: 245 mg/dL — ABNORMAL HIGH (ref 0–200)
HDL: 92.8 mg/dL (ref >39.00)
LDL Cholesterol: 128 mg/dL — ABNORMAL HIGH (ref 0–99)
NonHDL: 151.84
Total CHOL/HDL Ratio: 3
Triglycerides: 120 mg/dL (ref 0.0–149.0)
VLDL: 24 mg/dL (ref 0.0–40.0)

## 2018-10-30 LAB — HEMOGLOBIN A1C: Hgb A1c MFr Bld: 5.8 % (ref 4.6–6.5)

## 2018-10-30 NOTE — Progress Notes (Signed)
Subjective:     Patient ID: [name] [name], female   DOB: May 25, 1940, 78 y.o.   MRN: [id number]  HPI Patient is here for physical exam.  She is being treated for hypertension and hyperlipidemia.  She has history of some back difficulties but currently pain-free.  She is walking most days of the week.  Also does some exercise with recumbent bike.  No recent falls.  PHQ 2 equals 0  Patient has not had DEXA scan in several years but she declines.  She also declines further mammograms.  She gets flu shots yearly.  Due for tetanus 2021.  Pneumonia vaccines complete.  Declines shingles vaccine  In looking back she has had prediabetes range blood sugars by lab work last year.  No polyuria or polydipsia.  Appetite and weight stable  Past Medical History:  Diagnosis Date  . HYPERLIPIDEMIA 07/18/2008  . HYPERTENSION 07/18/2008  . OSTEOPOROSIS 07/25/2008   Past Surgical History:  Procedure Laterality Date  . CARPAL TUNNEL RELEASE  05/2018   right hand  . Bivalve    reports that she has never smoked. She has never used smokeless tobacco. She reports current alcohol use. She reports that she does not use drugs. family history includes Aneurysm in her father; Cancer in her paternal aunt and paternal grandmother; Hyperlipidemia in her mother and sister; Hypertension in her mother and sister; Stroke (age of onset: 56) in her father. No Known Allergies   Review of Systems  Constitutional: Negative for activity change, appetite change, fatigue, fever and unexpected weight change.  HENT: Negative for ear pain, hearing loss, sore throat and trouble swallowing.   Eyes: Negative for visual disturbance.  Respiratory: Negative for cough and shortness of breath.   Cardiovascular: Negative for chest pain and palpitations.  Gastrointestinal: Negative for abdominal pain, blood in stool, constipation and diarrhea.  Endocrine: Negative for polydipsia and polyuria.  Genitourinary: Negative for  dysuria and hematuria.  Musculoskeletal: Negative for arthralgias, back pain and myalgias.  Skin: Negative for rash.  Neurological: Negative for dizziness, syncope and headaches.  Hematological: Negative for adenopathy.  Psychiatric/Behavioral: Negative for confusion and dysphoric mood.       Objective:   Physical Exam Constitutional:      Appearance: She is well-developed.  HENT:     Head: Normocephalic and atraumatic.  Eyes:     Pupils: Pupils are equal, round, and reactive to light.  Neck:     Musculoskeletal: Normal range of motion and neck supple.     Thyroid: No thyromegaly.  Cardiovascular:     Rate and Rhythm: Normal rate and regular rhythm.     Heart sounds: Normal heart sounds. No murmur.  Pulmonary:     Effort: No respiratory distress.     Breath sounds: Normal breath sounds. No wheezing or rales.  Abdominal:     General: Bowel sounds are normal. There is no distension.     Palpations: Abdomen is soft. There is no mass.     Tenderness: There is no abdominal tenderness. There is no guarding or rebound.  Musculoskeletal: Normal range of motion.  Lymphadenopathy:     Cervical: No cervical adenopathy.  Skin:    Findings: No rash.  Neurological:     Mental Status: She is alert and oriented to person, place, and time.     Cranial Nerves: No cranial nerve deficit.     Deep Tendon Reflexes: Reflexes normal.  Psychiatric:        Behavior: Behavior  normal.        Thought Content: Thought content normal.        Judgment: Judgment normal.        Assessment:     Physical exam.  She has chronic problems of hypertension and hyperlipidemia which have been stable.  Prediabetes range blood sugars previously.  We recommended the following    Plan:     -Check labs including hemoglobin A1c -We discussed mammography screening and she declines -We discussed follow-up DEXA scanning and she declines.  She did take Fosamax for about 10 years but states that she would not wish  to look at any further prescription osteoporosis therapy -Continue daily calcium and vitamin D and regular weightbearing -Continue yearly flu vaccine  Kristian CoveyBruce W Lisvet Rasheed MD [facility]

## 2018-12-09 ENCOUNTER — Other Ambulatory Visit: Payer: Self-pay | Admitting: Family Medicine

## 2018-12-28 ENCOUNTER — Other Ambulatory Visit: Payer: Self-pay | Admitting: Family Medicine

## 2019-03-29 ENCOUNTER — Other Ambulatory Visit: Payer: Self-pay | Admitting: Family Medicine

## 2019-05-03 ENCOUNTER — Encounter: Payer: Self-pay | Admitting: Family Medicine

## 2019-05-03 ENCOUNTER — Other Ambulatory Visit: Payer: Self-pay

## 2019-05-03 ENCOUNTER — Ambulatory Visit (INDEPENDENT_AMBULATORY_CARE_PROVIDER_SITE_OTHER): Payer: Medicare HMO | Admitting: Family Medicine

## 2019-05-03 VITALS — BP 134/76 | HR 92 | Temp 97.9°F | Ht 63.25 in | Wt 130.2 lb

## 2019-05-03 DIAGNOSIS — R7303 Prediabetes: Secondary | ICD-10-CM

## 2019-05-03 DIAGNOSIS — I10 Essential (primary) hypertension: Secondary | ICD-10-CM | POA: Diagnosis not present

## 2019-05-03 DIAGNOSIS — E785 Hyperlipidemia, unspecified: Secondary | ICD-10-CM | POA: Diagnosis not present

## 2019-05-03 NOTE — Progress Notes (Signed)
  Subjective:     Patient ID: [name] [name], female   DOB: 1940/07/09, 79 y.o.   MRN: [id number]  HPI Patient is seen for medical follow-up.  She has hypertension treated with amlodipine and losartan HCTZ.  She has not been monitoring blood pressures very often.  Compliant with medications.  No recent headaches or dizziness.  Takes atorvastatin for hyperlipidemia.  She had lipids checked last summer.  These were still slightly elevated.  She is reluctant to take increased statin.  No family history of premature CAD.  She has history of prediabetes range blood sugars.  No polyuria or polydipsia.  Weight stable.  Recent A1c 5.8%. She has osteoporosis.  She stays very active with walking.  She takes calcium and vitamin D.  She is declined taking any further osteoporosis medications.  No recent falls.  Past Medical History:  Diagnosis Date  . HYPERLIPIDEMIA 07/18/2008  . HYPERTENSION 07/18/2008  . OSTEOPOROSIS 07/25/2008   Past Surgical History:  Procedure Laterality Date  . CARPAL TUNNEL RELEASE  05/2018   right hand  . LUMBAR DISC SURGERY  1998    reports that she has never smoked. She has never used smokeless tobacco. She reports current alcohol use. She reports that she does not use drugs. family history includes Aneurysm in her father; Cancer in her paternal aunt and paternal grandmother; Hyperlipidemia in her mother and sister; Hypertension in her mother and sister; Stroke (age of onset: 86) in her father. No Known Allergies   Review of Systems  Constitutional: Negative for appetite change, chills, fatigue, fever and unexpected weight change.  Eyes: Negative for visual disturbance.  Respiratory: Negative for cough, chest tightness, shortness of breath and wheezing.   Cardiovascular: Negative for chest pain, palpitations and leg swelling.  Endocrine: Negative for polydipsia and polyuria.  Genitourinary: Negative for dysuria.  Neurological: Negative for dizziness, seizures, syncope,  weakness, light-headedness and headaches.       Objective:   Physical Exam Constitutional:      Appearance: She is well-developed.  Eyes:     Pupils: Pupils are equal, round, and reactive to light.  Neck:     Thyroid: No thyromegaly.     Vascular: No JVD.  Cardiovascular:     Rate and Rhythm: Normal rate and regular rhythm.     Heart sounds: No gallop.   Pulmonary:     Effort: Pulmonary effort is normal. No respiratory distress.     Breath sounds: Normal breath sounds. No wheezing or rales.  Musculoskeletal:     Cervical back: Neck supple.     Right lower leg: No edema.     Left lower leg: No edema.  Neurological:     Mental Status: She is alert.        Assessment:     #1 hypertension stable  #2 hyperlipidemia.  Treated with Lipitor.  #3 history of osteoporosis.  No recent falls.  Patient declining further osteoporosis medical therapy  #4 history of prediabetes range blood sugars    Plan:     -Continue low glycemic diet -Answered some questions they had about Covid vaccination.  They were mostly concerned about how they could sign up. -Continue current medications - 75-month follow-up and obtain follow-up labs then  Kristian Covey MD [facility] at [facility]

## 2019-05-29 ENCOUNTER — Ambulatory Visit: Payer: Medicare HMO | Attending: Internal Medicine

## 2019-05-29 DIAGNOSIS — Z23 Encounter for immunization: Secondary | ICD-10-CM

## 2019-05-29 NOTE — Progress Notes (Signed)
   Covid-19 Vaccination Clinic  Name:  [name] [name]    MRN: [id number] DOB: 06/17/1940  05/29/2019  [name] was observed post Covid-19 immunization for 15 minutes without incidence. She was provided with Vaccine Information Sheet and instruction to access the V-Safe system.   [name] was instructed to call 911 with any severe reactions post vaccine: Marland Kitchen Difficulty breathing  . Swelling of your face and throat  . A fast heartbeat  . A bad rash all over your body  . Dizziness and weakness    Immunizations Administered    Name Date Dose VIS Date Route   Pfizer COVID-19 Vaccine 05/29/2019  9:56 AM 0.3 mL 03/26/2019 Intramuscular   Manufacturer: [employer], [employer]   Lot: [id number]   NDC: [address]-2

## 2019-06-08 ENCOUNTER — Other Ambulatory Visit: Payer: Self-pay | Admitting: Family Medicine

## 2019-06-20 ENCOUNTER — Ambulatory Visit: Payer: Medicare HMO | Attending: Internal Medicine

## 2019-06-20 DIAGNOSIS — Z23 Encounter for immunization: Secondary | ICD-10-CM

## 2019-06-20 NOTE — Progress Notes (Signed)
   Covid-19 Vaccination Clinic  Name:  KATESHA EICHEL    MRN: [id number] DOB: [birth year]  06/20/2019  Ms. Monfils was observed post Covid-19 immunization for 15 minutes without incident. She was provided with Vaccine Information Sheet and instruction to access the V-Safe system.   Ms. Dillon was instructed to call 911 with any severe reactions post vaccine: Marland Kitchen Difficulty breathing  . Swelling of face and throat  . A fast heartbeat  . A bad rash all over body  . Dizziness and weakness   Immunizations Administered    Name Date Dose VIS Date Route   Pfizer COVID-19 Vaccine 06/20/2019  3:12 PM 0.3 mL 03/26/2019 Intramuscular   Manufacturer: [employer], [employer]   Lot: [id number]   NDC: [address]-3

## 2019-06-29 ENCOUNTER — Other Ambulatory Visit: Payer: Self-pay | Admitting: Family Medicine

## 2019-09-07 ENCOUNTER — Other Ambulatory Visit: Payer: Self-pay | Admitting: Family Medicine

## 2019-11-01 ENCOUNTER — Other Ambulatory Visit: Payer: Self-pay

## 2019-11-01 ENCOUNTER — Ambulatory Visit (INDEPENDENT_AMBULATORY_CARE_PROVIDER_SITE_OTHER): Payer: Medicare HMO | Admitting: Family Medicine

## 2019-11-01 ENCOUNTER — Encounter: Payer: Self-pay | Admitting: Family Medicine

## 2019-11-01 VITALS — BP 140/72 | HR 92 | Temp 98.0°F | Ht 63.25 in | Wt 130.6 lb

## 2019-11-01 DIAGNOSIS — I10 Essential (primary) hypertension: Secondary | ICD-10-CM | POA: Diagnosis not present

## 2019-11-01 DIAGNOSIS — E785 Hyperlipidemia, unspecified: Secondary | ICD-10-CM | POA: Diagnosis not present

## 2019-11-01 DIAGNOSIS — M810 Age-related osteoporosis without current pathological fracture: Secondary | ICD-10-CM | POA: Diagnosis not present

## 2019-11-01 NOTE — Patient Instructions (Signed)
Recommend daily calcium 1,200 mg and Vit D 800-1,000 IU.

## 2019-11-01 NOTE — Progress Notes (Signed)
Established Patient Office Visit  Subjective:  Patient ID: [name] [name], female    DOB: 07-Apr-1941  Age: 79 y.o. MRN: [id number]  CC:  Chief Complaint  Patient presents with  . Follow-up    HPI [name] [name] presents for medical follow-up.  She has history of hyperlipidemia, hypertension, osteoporosis.  She took Fosamax for several years.  She declines further therapy and declines follow-up DEXA.  No recent falls.  She stays fairly active.  She tries to walk some for exercise.  She is taking some calcium supplement and some vitamin D but is not sure of the amount.  Her blood pressure has been stable on amlodipine and Hyzaar.  She did not take her medication this morning.  She takes Lipitor for hyperlipidemia.  Compliant with medications.  Denies any side effects.  No recent chest pains.  No dyspnea.  Covid vaccine already given.  Past Medical History:  Diagnosis Date  . HYPERLIPIDEMIA 07/18/2008  . HYPERTENSION 07/18/2008  . OSTEOPOROSIS 07/25/2008    Past Surgical History:  Procedure Laterality Date  . CARPAL TUNNEL RELEASE  05/2018   right hand  . LUMBAR DISC SURGERY  1998    Family History  Problem Relation Age of Onset  . Hyperlipidemia Mother   . Hypertension Mother   . Aneurysm Father   . Stroke Father 44       cerebral aneurysm  . Hyperlipidemia Sister   . Hypertension Sister   . Cancer Paternal Aunt        breast  . Cancer Paternal Grandmother     Social History   Socioeconomic History  . Marital status: Married    Spouse name: Not on file  . Number of children: Not on file  . Years of education: Not on file  . Highest education level: Not on file  Occupational History  . Not on file  Tobacco Use  . Smoking status: Never Smoker  . Smokeless tobacco: Never Used  Vaping Use  . Vaping Use: Never used  Substance and Sexual Activity  . Alcohol use: Yes    Comment: WINE  . Drug use: No  . Sexual activity: Not on file  Other Topics Concern  . Not on  file  Social History Narrative  . Not on file   Social Determinants of Health   Financial Resource Strain:   . Difficulty of Paying Living Expenses:   Food Insecurity:   . Worried About Programme researcher, broadcasting/film/video in the Last Year:   . Barista in the Last Year:   Transportation Needs:   . Freight forwarder (Medical):   Marland Kitchen Lack of Transportation (Non-Medical):   Physical Activity:   . Days of Exercise per Week:   . Minutes of Exercise per Session:   Stress:   . Feeling of Stress :   Social Connections:   . Frequency of Communication with Friends and Family:   . Frequency of Social Gatherings with Friends and Family:   . Attends Religious Services:   . Active Member of Clubs or Organizations:   . Attends Banker Meetings:   Marland Kitchen Marital Status:   Intimate Partner Violence:   . Fear of Current or Ex-Partner:   . Emotionally Abused:   Marland Kitchen Physically Abused:   . Sexually Abused:     Outpatient Medications Prior to Visit  Medication Sig Dispense Refill  . amLODipine (NORVASC) 5 MG tablet TAKE 1 TABLET BY MOUTH EVERY DAY 90 tablet  1  . atorvastatin (LIPITOR) 20 MG tablet TAKE 1 TABLET BY MOUTH EVERY DAY 90 tablet 1  . Calcium Citrate-Vitamin D (CITRACAL/VITAMIN D PO) Take 1 tablet by mouth daily.    Marland Kitchen losartan-hydrochlorothiazide (HYZAAR) 100-12.5 MG tablet TAKE 1 TABLET BY MOUTH EVERY DAY 90 tablet 1  . aspirin 81 MG tablet Take 1 tablet (81 mg total) daily by mouth. (Patient not taking: Reported on 03/02/2018) 30 tablet    No facility-administered medications prior to visit.    No Known Allergies  ROS Review of Systems  Constitutional: Negative for fatigue and unexpected weight change.  Eyes: Negative for visual disturbance.  Respiratory: Negative for cough, chest tightness, shortness of breath and wheezing.   Cardiovascular: Negative for chest pain, palpitations and leg swelling.  Endocrine: Negative for polydipsia and polyuria.  Neurological: Negative for  dizziness, seizures, syncope, weakness, light-headedness and headaches.      Objective:    Physical Exam Constitutional:      Appearance: She is well-developed.  Eyes:     Pupils: Pupils are equal, round, and reactive to light.  Neck:     Thyroid: No thyromegaly.     Vascular: No JVD.  Cardiovascular:     Rate and Rhythm: Normal rate and regular rhythm.     Heart sounds: No gallop.   Pulmonary:     Effort: Pulmonary effort is normal. No respiratory distress.     Breath sounds: Normal breath sounds. No wheezing or rales.  Musculoskeletal:     Cervical back: Neck supple.     Right lower leg: No edema.     Left lower leg: No edema.  Neurological:     Mental Status: She is alert.     BP 140/72 (BP Location: Left Arm, Patient Position: Sitting, Cuff Size: Normal)   Pulse 92   Temp 98 F (36.7 C) (Oral)   Ht 5' 3.25" (1.607 m)   Wt 130 lb 9.6 oz (59.2 kg)   BMI 22.95 kg/m  Wt Readings from Last 3 Encounters:  11/01/19 130 lb 9.6 oz (59.2 kg)  05/03/19 130 lb 3.2 oz (59.1 kg)  10/30/18 128 lb (58.1 kg)     Health Maintenance Due  Topic Date Due  . DEXA SCAN  Never done  . TETANUS/TDAP  05/27/2019    There are no preventive care reminders to display for this patient.  Lab Results  Component Value Date   TSH 2.65 06/17/2016   Lab Results  Component Value Date   WBC 6.9 03/27/2017   HGB 11.5 (L) 03/27/2017   HCT 35.2 (L) 03/27/2017   MCV 94.6 03/27/2017   PLT 366.0 03/27/2017   Lab Results  Component Value Date   NA 138 10/30/2018   K 4.2 10/30/2018   CO2 31 10/30/2018   GLUCOSE 112 (H) 10/30/2018   BUN 20 10/30/2018   CREATININE 1.18 10/30/2018   BILITOT 0.8 10/30/2018   ALKPHOS 75 10/30/2018   AST 16 10/30/2018   ALT 13 10/30/2018   PROT 7.0 10/30/2018   ALBUMIN 4.7 10/30/2018   CALCIUM 10.0 10/30/2018   ANIONGAP 10 02/18/2017   GFR 44.06 (L) 10/30/2018   Lab Results  Component Value Date   CHOL 245 (H) 10/30/2018   Lab Results  Component  Value Date   HDL 92.80 10/30/2018   Lab Results  Component Value Date   LDLCALC 128 (H) 10/30/2018   Lab Results  Component Value Date   TRIG 120.0 10/30/2018   Lab Results  Component  Value Date   CHOLHDL 3 10/30/2018   Lab Results  Component Value Date   HGBA1C 5.8 10/30/2018      Assessment & Plan:   #1 hypertension history.  Stable.  Continue current medications. -Check basic metabolic panel today  #2 hyperlipidemia treated with Lipitor -Check lipid and hepatic panel today  #3 osteoporosis.  She been treated previously with Fosamax.  We discussed periodic therapy with bisphosphonates but at this point she declines further use. -Continue regular weightbearing exercise -Continue at least 1200 mg calcium per day and vitamin D 800,000 international units daily  No orders of the defined types were placed in this encounter.   Follow-up: Return in about 1 year (around 10/31/2020).    Evelena Peat, MD

## 2019-11-02 LAB — HEPATIC FUNCTION PANEL
AG Ratio: 1.9 (calc) (ref 1.0–2.5)
ALT: 12 U/L (ref 6–29)
AST: 17 U/L (ref 10–35)
Albumin: 4.6 g/dL (ref 3.6–5.1)
Alkaline phosphatase (APISO): 74 U/L (ref 37–153)
Bilirubin, Direct: 0.1 mg/dL (ref 0.0–0.2)
Globulin: 2.4 g/dL (calc) (ref 1.9–3.7)
Indirect Bilirubin: 0.5 mg/dL (calc) (ref 0.2–1.2)
Total Bilirubin: 0.6 mg/dL (ref 0.2–1.2)
Total Protein: 7 g/dL (ref 6.1–8.1)

## 2019-11-02 LAB — BASIC METABOLIC PANEL
BUN/Creatinine Ratio: 15 (calc) (ref 6–22)
BUN: 19 mg/dL (ref 7–25)
CO2: 30 mmol/L (ref 20–32)
Calcium: 10 mg/dL (ref 8.6–10.4)
Chloride: 100 mmol/L (ref 98–110)
Creat: 1.28 mg/dL — ABNORMAL HIGH (ref 0.60–0.88)
Glucose, Bld: 114 mg/dL — ABNORMAL HIGH (ref 65–99)
Potassium: 4.4 mmol/L (ref 3.5–5.3)
Sodium: 138 mmol/L (ref 135–146)

## 2019-11-02 LAB — LIPID PANEL
Cholesterol: 246 mg/dL — ABNORMAL HIGH (ref <200)
HDL: 96 mg/dL (ref >=50)
LDL Cholesterol (Calc): 130 mg/dL (calc) — ABNORMAL HIGH
Non-HDL Cholesterol (Calc): 150 mg/dL (calc) — ABNORMAL HIGH (ref <130)
Total CHOL/HDL Ratio: 2.6 (calc) (ref <5.0)
Triglycerides: 96 mg/dL (ref <150)

## 2019-12-04 ENCOUNTER — Other Ambulatory Visit: Payer: Self-pay | Admitting: Family Medicine

## 2019-12-27 ENCOUNTER — Other Ambulatory Visit: Payer: Self-pay | Admitting: Family Medicine

## 2020-03-04 ENCOUNTER — Other Ambulatory Visit: Payer: Self-pay | Admitting: Family Medicine

## 2020-04-09 ENCOUNTER — Encounter (HOSPITAL_COMMUNITY): Payer: Self-pay

## 2020-04-09 ENCOUNTER — Emergency Department (HOSPITAL_COMMUNITY)
Admission: EM | Admit: 2020-04-09 | Discharge: 2020-04-09 | Disposition: A | Discharge status: Home / Self Care | Payer: Medicare HMO | Attending: Emergency Medicine | Admitting: Emergency Medicine

## 2020-04-09 ENCOUNTER — Encounter: Payer: Self-pay | Admitting: Family Medicine

## 2020-04-09 DIAGNOSIS — I10 Essential (primary) hypertension: Secondary | ICD-10-CM | POA: Insufficient documentation

## 2020-04-09 DIAGNOSIS — Z79899 Other long term (current) drug therapy: Secondary | ICD-10-CM | POA: Diagnosis not present

## 2020-04-09 DIAGNOSIS — R0902 Hypoxemia: Secondary | ICD-10-CM | POA: Diagnosis not present

## 2020-04-09 DIAGNOSIS — R55 Syncope and collapse: Secondary | ICD-10-CM | POA: Diagnosis not present

## 2020-04-09 DIAGNOSIS — R112 Nausea with vomiting, unspecified: Secondary | ICD-10-CM | POA: Diagnosis not present

## 2020-04-09 DIAGNOSIS — W19XXXA Unspecified fall, initial encounter: Secondary | ICD-10-CM | POA: Diagnosis not present

## 2020-04-09 DIAGNOSIS — I959 Hypotension, unspecified: Secondary | ICD-10-CM | POA: Diagnosis not present

## 2020-04-09 LAB — CBC WITH DIFFERENTIAL/PLATELET
Abs Immature Granulocytes: 0.04 10*3/uL (ref 0.00–0.07)
Basophils Absolute: 0 10*3/uL (ref 0.0–0.1)
Basophils Relative: 0 %
Eosinophils Absolute: 0 10*3/uL (ref 0.0–0.5)
Eosinophils Relative: 0 %
HCT: 38.9 % (ref 36.0–46.0)
Hemoglobin: 13.2 g/dL (ref 12.0–15.0)
Immature Granulocytes: 0 %
Lymphocytes Relative: 5 %
Lymphs Abs: 0.6 10*3/uL — ABNORMAL LOW (ref 0.7–4.0)
MCH: 32.1 pg (ref 26.0–34.0)
MCHC: 33.9 g/dL (ref 30.0–36.0)
MCV: 94.6 fL (ref 80.0–100.0)
Monocytes Absolute: 0.6 10*3/uL (ref 0.1–1.0)
Monocytes Relative: 4 %
Neutro Abs: 11.7 10*3/uL — ABNORMAL HIGH (ref 1.7–7.7)
Neutrophils Relative %: 91 %
Platelets: 250 10*3/uL (ref 150–400)
RBC: 4.11 MIL/uL (ref 3.87–5.11)
RDW: 13.7 % (ref 11.5–15.5)
WBC: 13 10*3/uL — ABNORMAL HIGH (ref 4.0–10.5)
nRBC: 0 % (ref 0.0–0.2)

## 2020-04-09 LAB — BASIC METABOLIC PANEL WITH GFR
Anion gap: 13 (ref 5–15)
BUN: 23 mg/dL (ref 8–23)
CO2: 26 mmol/L (ref 22–32)
Calcium: 9.2 mg/dL (ref 8.9–10.3)
Chloride: 100 mmol/L (ref 98–111)
Creatinine, Ser: 1.33 mg/dL — ABNORMAL HIGH (ref 0.44–1.00)
GFR, Estimated: 40 mL/min — ABNORMAL LOW
Glucose, Bld: 116 mg/dL — ABNORMAL HIGH (ref 70–99)
Potassium: 3.6 mmol/L (ref 3.5–5.1)
Sodium: 139 mmol/L (ref 135–145)

## 2020-04-09 LAB — TROPONIN I (HIGH SENSITIVITY): Troponin I (High Sensitivity): 5 ng/L (ref <18)

## 2020-04-09 LAB — CBG MONITORING, ED: Glucose-Capillary: 119 mg/dL — ABNORMAL HIGH (ref 70–99)

## 2020-04-09 MED ORDER — ONDANSETRON HCL 4 MG/2ML IJ SOLN
4.0000 mg | Freq: Once | INTRAMUSCULAR | Status: AC
  Administered 2020-04-09: 06:00:00 4 mg via INTRAVENOUS
  Filled 2020-04-09: qty 2

## 2020-04-09 MED ORDER — ONDANSETRON 8 MG PO TBDP: 8.0000 mg | ORAL_TABLET | Freq: Three times a day (TID) | ORAL | 0 refills | Status: DC | PRN

## 2020-04-09 NOTE — ED Notes (Signed)
CBG 119 

## 2020-04-09 NOTE — ED Provider Notes (Signed)
[facility]-EMERGENCY DEPT Provider Note: [name] Dell, MD, FACEP  CSN: [id number] MRN: [id number] ARRIVAL: 04/09/20 at 0419 ROOM: WA10/WA10   CHIEF COMPLAINT  Syncope   HISTORY OF PRESENT ILLNESS  04/09/20 5:22 AM [name] [name] is a 79 y.o. female who woke up during the night "not feeling well".  He got up to go urinate and came back to bed.  She still was not feeling well and got up and vomited on 2 occasions.  She got up again to go to the bathroom and had a syncopal episode striking her head against the floor.  She was briefly confused but soon came back to her baseline.  It is unclear if there was an actual loss of consciousness or just weakness that caused her to collapse.  She is not on any anticoagulation.  She has not had a fever or URI symptoms.   Past Medical History:  Diagnosis Date   HYPERLIPIDEMIA 07/18/2008   HYPERTENSION 07/18/2008   OSTEOPOROSIS 07/25/2008    Past Surgical History:  Procedure Laterality Date   CARPAL TUNNEL RELEASE  05/2018   right hand   LUMBAR DISC SURGERY  1998    Family History  Problem Relation Age of Onset   Hyperlipidemia Mother    Hypertension Mother    Aneurysm Father    Stroke Father 87       cerebral aneurysm   Hyperlipidemia Sister    Hypertension Sister    Cancer Paternal Aunt        breast   Cancer Paternal Grandmother     Social History   Tobacco Use   Smoking status: Never Smoker   Smokeless tobacco: Never Used  Vaping Use   Vaping Use: Never used  Substance Use Topics   Alcohol use: Yes    Comment: WINE   Drug use: No    Prior to Admission medications   Medication Sig Start Date End Date Taking? Authorizing Provider  amLODipine (NORVASC) 5 MG tablet TAKE 1 TABLET BY MOUTH EVERY DAY 03/06/20  Yes Burchette, Elberta Fortis, MD  atorvastatin (LIPITOR) 20 MG tablet TAKE 1 TABLET BY MOUTH EVERY DAY 12/28/19  Yes Burchette, Elberta Fortis, MD  Calcium Citrate-Vitamin D (CITRACAL/VITAMIN D PO) Take 1 tablet by mouth  daily.   Yes [provider]  losartan-hydrochlorothiazide (HYZAAR) 100-12.5 MG tablet TAKE 1 TABLET BY MOUTH EVERY DAY 12/06/19  Yes Burchette, Elberta Fortis, MD  ondansetron (ZOFRAN ODT) 8 MG disintegrating tablet Take 1 tablet (8 mg total) by mouth every 8 (eight) hours as needed for nausea or vomiting. 04/09/20   Reita Shindler, Jonny Ruiz, MD    Allergies [name].   REVIEW OF SYSTEMS  Negative except as noted here or in the History of Present Illness.   PHYSICAL EXAMINATION  Initial Vital Signs Blood pressure (!) 120/59, pulse 91, temperature 97.9 F (36.6 C), temperature source Oral, resp. rate 17, height 5\' 5"  (1.651 m), weight 56.7 kg, SpO2 96 %.  Examination General: Well-developed, well-nourished female in no acute distress; appearance consistent with age of record HENT: normocephalic; small left parietal hematoma Eyes: pupils equal, round and reactive to light; extraocular muscles intact Neck: supple; nontender Heart: regular rate and rhythm Lungs: clear to auscultation bilaterally Abdomen: soft; nondistended; nontender; bowel sounds present Extremities: No deformity; full range of motion; pulses normal Neurologic: Awake, alert and oriented; motor function intact in all extremities and symmetric; no facial droop Skin: Warm and dry Psychiatric: Normal mood and affect   RESULTS  Summary of this visit's results, reviewed and interpreted by myself:   EKG Interpretation  Date/Time:  Sunday April 09 2020 04:45:33 EST Ventricular Rate:  89 PR Interval:    QRS Duration: 85 QT Interval:  388 QTC Calculation: 473 R Axis:   31 Text Interpretation: Sinus rhythm Probable inferior infarct, age indeterminate Confirmed by Paula Libra ([id number]) on 04/09/2020 5:23:21 AM      Laboratory Studies: Results for orders placed or performed during the hospital encounter of 04/09/20 (from the past 24 hour(s))  CBG monitoring, ED     Status: Abnormal   Collection Time:  04/09/20  4:58 AM  Result Value Ref Range   Glucose-Capillary 119 (H) 70 - 99 mg/dL  Basic metabolic panel     Status: Abnormal   Collection Time: 04/09/20  4:59 AM  Result Value Ref Range   Sodium 139 135 - 145 mmol/L   Potassium 3.6 3.5 - 5.1 mmol/L   Chloride 100 98 - 111 mmol/L   CO2 26 22 - 32 mmol/L   Glucose, Bld 116 (H) 70 - 99 mg/dL   BUN 23 8 - 23 mg/dL   Creatinine, Ser 9.24 (H) 0.44 - 1.00 mg/dL   Calcium 9.2 8.9 - 26.8 mg/dL   GFR, Estimated 40 (L) >60 mL/min   Anion gap 13 5 - 15  CBC with Differential/Platelet     Status: Abnormal   Collection Time: 04/09/20  4:59 AM  Result Value Ref Range   WBC 13.0 (H) 4.0 - 10.5 K/uL   RBC 4.11 3.87 - 5.11 MIL/uL   Hemoglobin 13.2 12.0 - 15.0 g/dL   HCT 34.1 96.2 - 22.9 %   MCV 94.6 80.0 - 100.0 fL   MCH 32.1 26.0 - 34.0 pg   MCHC 33.9 30.0 - 36.0 g/dL   RDW 79.8 92.1 - 19.4 %   Platelets 250 150 - 400 K/uL   nRBC 0.0 0.0 - 0.2 %   Neutrophils Relative % 91 %   Neutro Abs 11.7 (H) 1.7 - 7.7 K/uL   Lymphocytes Relative 5 %   Lymphs Abs 0.6 (L) 0.7 - 4.0 K/uL   Monocytes Relative 4 %   Monocytes Absolute 0.6 0.1 - 1.0 K/uL   Eosinophils Relative 0 %   Eosinophils Absolute 0.0 0.0 - 0.5 K/uL   Basophils Relative 0 %   Basophils Absolute 0.0 0.0 - 0.1 K/uL   Immature Granulocytes 0 %   Abs Immature Granulocytes 0.04 0.00 - 0.07 K/uL  Troponin I (High Sensitivity)     Status: None   Collection Time: 04/09/20  4:59 AM  Result Value Ref Range   Troponin I (High Sensitivity) 5 <18 ng/L   Imaging Studies: No results found.  ED COURSE and MDM  Nursing notes, initial and subsequent vitals signs, including pulse oximetry, reviewed and interpreted by myself.  Vitals:   04/09/20 0442 04/09/20 0500 04/09/20 0502 04/09/20 0600  BP: (!) 120/59   124/62  Pulse: 91   95  Resp: 17   17  Temp: 97.9 F (36.6 C)     TempSrc: Oral     SpO2: 95%  96% 97%  Weight:  56.7 kg    Height:  5\' 5"  (1.651 m)     Medications   ondansetron (ZOFRAN) injection 4 mg (4 mg Intravenous Given 04/09/20 0609)   6:39 AM Patient states she feels back to baseline.  She has ambulated without difficulty.  She is drinking fluids without vomiting.  I suspect her  syncopal episode was vasovagal due to nausea and vomiting.  This likely is related to an acute viral illness.  We will treat with Zofran.  Her husband will observe her and return her to the ED if symptoms worsen.   PROCEDURES  Procedures   ED DIAGNOSES     ICD-10-CM   1. Vasovagal syncope  R55   2. Nausea and vomiting in adult  R11.2        Paula Libra, MD 04/09/20 [phone]

## 2020-04-09 NOTE — ED Triage Notes (Signed)
Patient arrives via EMS from home with complaint of waking up during the night "not feeling well", reports 1 episode of vomiting and 1 episode of uncontrollable solid stool. Pt's husband reports once episode of syncope in which the patient fell and struck her head. No LOC, no blood thinners.   EMS vitals: BP 128/60 HR 90  RR 20  SPO2 98%  CBG 145

## 2020-04-26 ENCOUNTER — Ambulatory Visit (INDEPENDENT_AMBULATORY_CARE_PROVIDER_SITE_OTHER): Payer: Medicare HMO | Admitting: Family Medicine

## 2020-04-26 ENCOUNTER — Other Ambulatory Visit: Payer: Self-pay

## 2020-04-26 ENCOUNTER — Encounter: Payer: Self-pay | Admitting: Family Medicine

## 2020-04-26 VITALS — BP 122/78 | HR 94 | Temp 97.8°F | Ht 65.0 in | Wt 131.0 lb

## 2020-04-26 DIAGNOSIS — I10 Essential (primary) hypertension: Secondary | ICD-10-CM | POA: Diagnosis not present

## 2020-04-26 DIAGNOSIS — E785 Hyperlipidemia, unspecified: Secondary | ICD-10-CM | POA: Diagnosis not present

## 2020-04-26 DIAGNOSIS — R7303 Prediabetes: Secondary | ICD-10-CM | POA: Diagnosis not present

## 2020-04-26 LAB — POCT GLYCOSYLATED HEMOGLOBIN (HGB A1C): Hemoglobin A1C: 5.6 % (ref 4.0–5.6)

## 2020-04-26 NOTE — Progress Notes (Signed)
Established Patient Office Visit  Subjective:  Patient ID: [name] [name], female    DOB: Jun 14, 1940  Age: 80 y.o. MRN: [id number]  CC:  Chief Complaint  Patient presents with  . Follow-up    6 month follow up on A1c, no concerns.     HPI [name] [name] presents for medical follow-up.  She has history of hypertension, osteoporosis, prediabetes, hyperlipidemia.  She picked up GI bug back in December right after Christmas.  On the 26th she had some nausea, vomiting, and diarrhea.  She got up around 3 AM to go to the bathroom and had syncopal episode.  They called 911.  She was taken to the ER for further evaluation.  White count 13.0 thousand.  Creatinine 1.33.  She promptly improved and blood pressure stabilized after IV fluids.  She has had no recurrent symptoms since then.  No lightheadedness or dizziness since then.  She remains on amlodipine and losartan HCTZ for hypertension.  She takes Lipitor 20 mg daily for lipids.  Lipids were checked last July.  She declines further osteoporosis therapies.  No recent falls.  History of prediabetes.  Last A1c 5.8%.  She tries to watch her sugar intake closely.  Does sometimes drink fruit juices.  Past Medical History:  Diagnosis Date  . HYPERLIPIDEMIA 07/18/2008  . HYPERTENSION 07/18/2008  . OSTEOPOROSIS 07/25/2008    Past Surgical History:  Procedure Laterality Date  . CARPAL TUNNEL RELEASE  05/2018   right hand  . LUMBAR DISC SURGERY  1998    Family History  Problem Relation Age of Onset  . Hyperlipidemia Mother   . Hypertension Mother   . Aneurysm Father   . Stroke Father 33       cerebral aneurysm  . Hyperlipidemia Sister   . Hypertension Sister   . Cancer Paternal Aunt        breast  . Cancer Paternal Grandmother     Social History   Socioeconomic History  . Marital status: Married    Spouse name: Not on file  . Number of children: Not on file  . Years of education: Not on file  . Highest education level: Not on file   Occupational History  . Not on file  Tobacco Use  . Smoking status: Never Smoker  . Smokeless tobacco: Never Used  Vaping Use  . Vaping Use: Never used  Substance and Sexual Activity  . Alcohol use: Yes    Comment: WINE  . Drug use: No  . Sexual activity: Not on file  Other Topics Concern  . Not on file  Social History Narrative  . Not on file   Social Determinants of Health   Financial Resource Strain: Not on file  Food Insecurity: Not on file  Transportation Needs: Not on file  Physical Activity: Not on file  Stress: Not on file  Social Connections: Not on file  Intimate Partner Violence: Not on file    Outpatient Medications Prior to Visit  Medication Sig Dispense Refill  . amLODipine (NORVASC) 5 MG tablet TAKE 1 TABLET BY MOUTH EVERY DAY 90 tablet 1  . atorvastatin (LIPITOR) 20 MG tablet TAKE 1 TABLET BY MOUTH EVERY DAY 90 tablet 1  . Calcium Citrate-Vitamin D (CITRACAL/VITAMIN D PO) Take 1 tablet by mouth daily.    Marland Kitchen losartan-hydrochlorothiazide (HYZAAR) 100-12.5 MG tablet TAKE 1 TABLET BY MOUTH EVERY DAY 90 tablet 1  . ondansetron (ZOFRAN ODT) 8 MG disintegrating tablet Take 1 tablet (8 mg total) by  mouth every 8 (eight) hours as needed for nausea or vomiting. (Patient not taking: Reported on 04/26/2020) 10 tablet 0   No facility-administered medications prior to visit.    No Known Allergies  ROS Review of Systems  Constitutional: Negative for fatigue and unexpected weight change.  Eyes: Negative for visual disturbance.  Respiratory: Negative for cough, chest tightness, shortness of breath and wheezing.   Cardiovascular: Negative for chest pain, palpitations and leg swelling.  Endocrine: Negative for polydipsia and polyuria.  Neurological: Negative for dizziness, seizures, syncope, weakness, light-headedness and headaches.      Objective:    Physical Exam Constitutional:      Appearance: She is well-developed and well-nourished.  Eyes:     Pupils:  Pupils are equal, round, and reactive to light.  Neck:     Thyroid: No thyromegaly.     Vascular: No JVD.  Cardiovascular:     Rate and Rhythm: Normal rate and regular rhythm.     Heart sounds: No gallop.   Pulmonary:     Effort: Pulmonary effort is normal. No respiratory distress.     Breath sounds: Normal breath sounds. No wheezing or rales.  Musculoskeletal:        General: No edema.     Cervical back: Neck supple.  Neurological:     Mental Status: She is alert.     BP 122/78 (BP Location: Left Arm, Cuff Size: Normal)   Pulse 94   Temp 97.8 F (36.6 C) (Temporal)   Ht 5\' 5"  (1.651 m)   Wt 131 lb (59.4 kg)   SpO2 96%   BMI 21.80 kg/m  Wt Readings from Last 3 Encounters:  04/26/20 131 lb (59.4 kg)  04/09/20 125 lb (56.7 kg)  11/01/19 130 lb 9.6 oz (59.2 kg)     Health Maintenance Due  Topic Date Due  . DEXA SCAN  Never done  . TETANUS/TDAP  05/27/2019    There are no preventive care reminders to display for this patient.  Lab Results  Component Value Date   TSH 2.65 06/17/2016   Lab Results  Component Value Date   WBC 13.0 (H) 04/09/2020   HGB 13.2 04/09/2020   HCT 38.9 04/09/2020   MCV 94.6 04/09/2020   PLT 250 04/09/2020   Lab Results  Component Value Date   NA 139 04/09/2020   K 3.6 04/09/2020   CO2 26 04/09/2020   GLUCOSE 116 (H) 04/09/2020   BUN 23 04/09/2020   CREATININE 1.33 (H) 04/09/2020   BILITOT 0.6 11/01/2019   ALKPHOS 75 10/30/2018   AST 17 11/01/2019   ALT 12 11/01/2019   PROT 7.0 11/01/2019   ALBUMIN 4.7 10/30/2018   CALCIUM 9.2 04/09/2020   ANIONGAP 13 04/09/2020   GFR 44.06 (L) 10/30/2018   Lab Results  Component Value Date   CHOL 246 (H) 11/01/2019   Lab Results  Component Value Date   HDL 96 11/01/2019   Lab Results  Component Value Date   LDLCALC 130 (H) 11/01/2019   Lab Results  Component Value Date   TRIG 96 11/01/2019   Lab Results  Component Value Date   CHOLHDL 2.6 11/01/2019   Lab Results   Component Value Date   HGBA1C 5.6 04/26/2020      Assessment & Plan:   #1 hypertension stable and at goal.  Continue amlodipine and losartan HCTZ.  Recheck BMP at follow-up  #2 prediabetes with A1c today stable at 5.6% -Continue low glycemic diet  #3 hyperlipidemia.  Treated with Lipitor. -Recheck lipid and hepatic panel at 77-month follow-up  No orders of the defined types were placed in this encounter.   Follow-up: Return today (on 04/26/2020).    Evelena Peat, MD

## 2020-04-26 NOTE — Patient Instructions (Signed)
Prediabetes Eating Plan Prediabetes is a condition that causes blood sugar (glucose) levels to be higher than normal. This increases the risk for developing type 2 diabetes (type 2 diabetes mellitus). Working with a health care provider or nutrition specialist (dietitian) to make diet and lifestyle changes can help prevent the onset of diabetes. These changes may help you:  Control your blood glucose levels.  Improve your cholesterol levels.  Manage your blood pressure. What are tips for following this plan? Reading food labels  Read food labels to check the amount of fat, salt (sodium), and sugar in prepackaged foods. Avoid foods that have: ? Saturated fats. ? Trans fats. ? Added sugars.  Avoid foods that have more than 300 milligrams (mg) of sodium per serving. Limit your sodium intake to less than 2,300 mg each day. Shopping  Avoid buying pre-made and processed foods.  Avoid buying drinks with added sugar. Cooking  Cook with olive oil. Do not use butter, lard, or ghee.  Bake, broil, grill, steam, or boil foods. Avoid frying. Meal planning  Work with your dietitian to create an eating plan that is right for you. This may include tracking how many calories you take in each day. Use a food diary, notebook, or mobile application to track what you eat at each meal.  Consider following a Mediterranean diet. This includes: ? Eating several servings of fresh fruits and vegetables each day. ? Eating fish at least twice a week. ? Eating one serving each day of whole grains, beans, nuts, and seeds. ? Using olive oil instead of other fats. ? Limiting alcohol. ? Limiting red meat. ? Using nonfat or low-fat dairy products.  Consider following a plant-based diet. This includes dietary choices that focus on eating mostly vegetables and fruit, grains, beans, nuts, and seeds.  If you have high blood pressure, you may need to limit your sodium intake or follow a diet such as the DASH  (Dietary Approaches to Stop Hypertension) eating plan. The DASH diet aims to lower high blood pressure.   Lifestyle  Set weight loss goals with help from your health care team. It is recommended that most people with prediabetes lose 7% of their body weight.  Exercise for at least 30 minutes 5 or more days a week.  Attend a support group or seek support from a mental health counselor.  Take over-the-counter and prescription medicines only as told by your health care provider. What foods are recommended? Fruits Berries. Bananas. Apples. Oranges. Grapes. Papaya. Mango. Pomegranate. Kiwi. Grapefruit. Cherries. Vegetables Lettuce. Spinach. Peas. Beets. Cauliflower. Cabbage. Broccoli. Carrots. Tomatoes. Squash. Eggplant. Herbs. Peppers. Onions. Cucumbers. Brussels sprouts. Grains Whole grains, such as whole-wheat or whole-grain breads, crackers, cereals, and pasta. Unsweetened oatmeal. Bulgur. Barley. Quinoa. Brown rice. Corn or whole-wheat flour tortillas or taco shells. Meats and other proteins Seafood. Poultry without skin. Lean cuts of pork and beef. Tofu. Eggs. Nuts. Beans. Dairy Low-fat or fat-free dairy products, such as yogurt, cottage cheese, and cheese. Beverages Water. Tea. Coffee. Sugar-free or diet soda. Seltzer water. Low-fat or nonfat milk. Milk alternatives, such as soy or almond milk. Fats and oils Olive oil. Canola oil. Sunflower oil. Grapeseed oil. Avocado. Walnuts. Sweets and desserts Sugar-free or low-fat pudding. Sugar-free or low-fat ice cream and other frozen treats. Seasonings and condiments Herbs. Sodium-free spices. Mustard. Relish. Low-salt, low-sugar ketchup. Low-salt, low-sugar barbecue sauce. Low-fat or fat-free mayonnaise. The items listed above may not be a complete list of recommended foods and beverages. Contact a dietitian for more   information. What foods are not recommended? Fruits Fruits canned with syrup. Vegetables Canned vegetables. Frozen  vegetables with butter or cream sauce. Grains Refined white flour and flour products, such as bread, pasta, snack foods, and cereals. Meats and other proteins Fatty cuts of meat. Poultry with skin. Breaded or fried meat. Processed meats. Dairy Full-fat yogurt, cheese, or milk. Beverages Sweetened drinks, such as iced tea and soda. Fats and oils Butter. Lard. Ghee. Sweets and desserts Baked goods, such as cake, cupcakes, pastries, cookies, and cheesecake. Seasonings and condiments Spice mixes with added salt. Ketchup. Barbecue sauce. Mayonnaise. The items listed above may not be a complete list of foods and beverages that are not recommended. Contact a dietitian for more information. Where to find more information  American Diabetes Association: www.diabetes.org Summary  You may need to make diet and lifestyle changes to help prevent the onset of diabetes. These changes can help you control blood sugar, improve cholesterol levels, and manage blood pressure.  Set weight loss goals with help from your health care team. It is recommended that most people with prediabetes lose 7% of their body weight.  Consider following a Mediterranean diet. This includes eating plenty of fresh fruits and vegetables, whole grains, beans, nuts, seeds, fish, and low-fat dairy, and using olive oil instead of other fats. This information is not intended to replace advice given to you by your health care provider. Make sure you discuss any questions you have with your health care provider. Document Revised: 07/01/2019 Document Reviewed: 07/01/2019 Elsevier Patient Education  2021 Elsevier Inc.  

## 2020-06-03 ENCOUNTER — Other Ambulatory Visit: Payer: Self-pay | Admitting: Family Medicine

## 2020-08-26 ENCOUNTER — Other Ambulatory Visit: Payer: Self-pay | Admitting: Family Medicine

## 2020-11-01 ENCOUNTER — Ambulatory Visit (INDEPENDENT_AMBULATORY_CARE_PROVIDER_SITE_OTHER): Payer: Medicare HMO | Admitting: Family Medicine

## 2020-11-01 ENCOUNTER — Other Ambulatory Visit: Payer: Self-pay

## 2020-11-01 ENCOUNTER — Encounter: Payer: Self-pay | Admitting: Family Medicine

## 2020-11-01 VITALS — BP 148/78 | HR 106 | Temp 97.9°F | Ht 65.0 in | Wt 128.9 lb

## 2020-11-01 DIAGNOSIS — Z0000 Encounter for general adult medical examination without abnormal findings: Secondary | ICD-10-CM

## 2020-11-01 LAB — HEPATIC FUNCTION PANEL
ALT: 11 U/L (ref 0–35)
AST: 17 U/L (ref 0–37)
Albumin: 4.6 g/dL (ref 3.5–5.2)
Alkaline Phosphatase: 78 U/L (ref 39–117)
Bilirubin, Direct: 0.1 mg/dL (ref 0.0–0.3)
Total Bilirubin: 0.8 mg/dL (ref 0.2–1.2)
Total Protein: 7 g/dL (ref 6.0–8.3)

## 2020-11-01 LAB — BASIC METABOLIC PANEL WITH GFR
BUN: 21 mg/dL (ref 6–23)
CO2: 27 meq/L (ref 19–32)
Calcium: 10 mg/dL (ref 8.4–10.5)
Chloride: 98 meq/L (ref 96–112)
Creatinine, Ser: 1.33 mg/dL — ABNORMAL HIGH (ref 0.40–1.20)
GFR: 37.36 mL/min — ABNORMAL LOW
Glucose, Bld: 121 mg/dL — ABNORMAL HIGH (ref 70–99)
Potassium: 4.2 meq/L (ref 3.5–5.1)
Sodium: 137 meq/L (ref 135–145)

## 2020-11-01 LAB — CBC WITH DIFFERENTIAL/PLATELET
Basophils Absolute: 0 10*3/uL (ref 0.0–0.1)
Basophils Relative: 0.6 % (ref 0.0–3.0)
Eosinophils Absolute: 0.1 10*3/uL (ref 0.0–0.7)
Eosinophils Relative: 1.6 % (ref 0.0–5.0)
HCT: 38.1 % (ref 36.0–46.0)
Hemoglobin: 13.1 g/dL (ref 12.0–15.0)
Lymphocytes Relative: 33.7 % (ref 12.0–46.0)
Lymphs Abs: 1.8 10*3/uL (ref 0.7–4.0)
MCHC: 34.3 g/dL (ref 30.0–36.0)
MCV: 92.9 fl (ref 78.0–100.0)
Monocytes Absolute: 0.5 10*3/uL (ref 0.1–1.0)
Monocytes Relative: 8.7 % (ref 3.0–12.0)
Neutro Abs: 3 10*3/uL (ref 1.4–7.7)
Neutrophils Relative %: 55.4 % (ref 43.0–77.0)
Platelets: 258 10*3/uL (ref 150.0–400.0)
RBC: 4.1 Mil/uL (ref 3.87–5.11)
RDW: 13.5 % (ref 11.5–15.5)
WBC: 5.4 10*3/uL (ref 4.0–10.5)

## 2020-11-01 LAB — LIPID PANEL
Cholesterol: 237 mg/dL — ABNORMAL HIGH (ref 0–200)
HDL: 84.8 mg/dL
LDL Cholesterol: 126 mg/dL — ABNORMAL HIGH (ref 0–99)
NonHDL: 152.4
Total CHOL/HDL Ratio: 3
Triglycerides: 134 mg/dL (ref 0.0–149.0)
VLDL: 26.8 mg/dL (ref 0.0–40.0)

## 2020-11-01 LAB — TSH: TSH: 3.95 u[IU]/mL (ref 0.35–5.50)

## 2020-11-01 NOTE — Progress Notes (Signed)
Established Patient Office Visit  Subjective:  Patient ID: [name] [name], female    DOB: 08-Nov-1940  Age: 80 y.o. MRN: [id number]  CC:  Chief Complaint  Patient presents with   Annual Exam    No new concerns     HPI [name] [name] presents for annual physical exam.  She is accompanied today by her husband.  She has history of hypertension, hyperlipidemia, osteoporosis.  She has declined further DEXA scans and declines further bone density treatment.  She did take Fosamax for a while in the past.  She does take Citracal D daily and tries to walk regularly.  Health maintenance reviewed  -She gets annual flu vaccine -Needs tetanus booster -No history of shingles vaccine -No recent DEXA scan -Aged out of colonoscopy -She declines further mammograms.  Family history-she had a brother that died age 77 of complications of some sort of congenital problem.  She thinks he had brain trauma at birth.  Father died age 2 of cerebral aneurysm.  She has a sister who is alive who has history of hypertension and hyperlipidemia.  Her mother also had hypertension and hyperlipidemia.  Social history-she is married.  Never smoked.  Only occasional consumption of wine at dinner usually only 2 or 3 ounces.  Past Medical History:  Diagnosis Date   HYPERLIPIDEMIA 07/18/2008   HYPERTENSION 07/18/2008   OSTEOPOROSIS 07/25/2008    Past Surgical History:  Procedure Laterality Date   CARPAL TUNNEL RELEASE  05/2018   right hand   LUMBAR DISC SURGERY  1998    Family History  Problem Relation Age of Onset   Hyperlipidemia Mother    Hypertension Mother    Aneurysm Father    Stroke Father 46       cerebral aneurysm   Hyperlipidemia Sister    Hypertension Sister    Cancer Paternal Aunt        breast   Cancer Paternal Grandmother     Social History   Socioeconomic History   Marital status: Married    Spouse name: Not on file   Number of children: Not on file   Years of education: Not on file    Highest education level: Not on file  Occupational History   Not on file  Tobacco Use   Smoking status: Never   Smokeless tobacco: Never  Vaping Use   Vaping Use: Never used  Substance and Sexual Activity   Alcohol use: Yes    Comment: WINE   Drug use: No   Sexual activity: Not on file  Other Topics Concern   Not on file  Social History Narrative   Not on file   Social Determinants of Health   Financial Resource Strain: Not on file  Food Insecurity: Not on file  Transportation Needs: Not on file  Physical Activity: Not on file  Stress: Not on file  Social Connections: Not on file  Intimate Partner Violence: Not on file    Outpatient Medications Prior to Visit  Medication Sig Dispense Refill   amLODipine (NORVASC) 5 MG tablet TAKE 1 TABLET BY MOUTH EVERY DAY 90 tablet 1   atorvastatin (LIPITOR) 20 MG tablet TAKE 1 TABLET BY MOUTH EVERY DAY 90 tablet 1   Calcium Citrate-Vitamin D (CITRACAL/VITAMIN D PO) Take 1 tablet by mouth daily.     losartan-hydrochlorothiazide (HYZAAR) 100-12.5 MG tablet TAKE 1 TABLET BY MOUTH EVERY DAY 90 tablet 1   ondansetron (ZOFRAN ODT) 8 MG disintegrating tablet Take 1 tablet (8 mg  total) by mouth every 8 (eight) hours as needed for nausea or vomiting. 10 tablet 0   No facility-administered medications prior to visit.    No Known Allergies  ROS Review of Systems  Constitutional:  Negative for activity change, appetite change, fatigue, fever and unexpected weight change.  HENT:  Negative for ear pain, hearing loss, sore throat and trouble swallowing.   Eyes:  Negative for visual disturbance.  Respiratory:  Negative for cough and shortness of breath.   Cardiovascular:  Negative for chest pain and palpitations.  Gastrointestinal:  Negative for abdominal pain, blood in stool, constipation and diarrhea.  Genitourinary:  Negative for dysuria and hematuria.  Musculoskeletal:  Negative for arthralgias, back pain and myalgias.  Skin:  Negative for  rash.  Neurological:  Negative for syncope and headaches.       Occasional lightheadedness with rapid standing.  No recent syncope.  Hematological:  Negative for adenopathy.  Psychiatric/Behavioral:  Negative for confusion and dysphoric mood.      Objective:    Physical Exam Constitutional:      Appearance: She is well-developed.  HENT:     Head:     Comments: She does have some cerumen in both ear canals Eyes:     Pupils: Pupils are equal, round, and reactive to light.  Neck:     Thyroid: No thyromegaly.     Vascular: No JVD.  Cardiovascular:     Rate and Rhythm: Normal rate and regular rhythm.     Heart sounds:    No gallop.  Pulmonary:     Effort: Pulmonary effort is normal. No respiratory distress.     Breath sounds: Normal breath sounds. No wheezing or rales.  Abdominal:     General: Bowel sounds are normal.     Palpations: Abdomen is soft. There is no mass.     Tenderness: There is no abdominal tenderness. There is no guarding.     Hernia: No hernia is present.  Musculoskeletal:     Cervical back: Neck supple.     Right lower leg: No edema.     Left lower leg: No edema.  Skin:    Findings: No rash.  Neurological:     General: No focal deficit present.     Mental Status: She is alert.     Cranial Nerves: No cranial nerve deficit.    BP (!) 148/78 (BP Location: Left Arm, Cuff Size: Normal)   Pulse (!) 106   Temp 97.9 F (36.6 C) (Oral)   Ht 5\' 5"  (1.651 m)   Wt 128 lb 14.4 oz (58.5 kg)   SpO2 94%   BMI 21.45 kg/m  Wt Readings from Last 3 Encounters:  11/01/20 128 lb 14.4 oz (58.5 kg)  04/26/20 131 lb (59.4 kg)  04/09/20 125 lb (56.7 kg)     Health Maintenance Due  Topic Date Due   Zoster Vaccines- Shingrix (1 of 2) Never done   DEXA SCAN  Never done   TETANUS/TDAP  05/27/2019    There are no preventive care reminders to display for this patient.  Lab Results  Component Value Date   TSH 2.65 06/17/2016   Lab Results  Component Value Date    WBC 13.0 (H) 04/09/2020   HGB 13.2 04/09/2020   HCT 38.9 04/09/2020   MCV 94.6 04/09/2020   PLT 250 04/09/2020   Lab Results  Component Value Date   NA 139 04/09/2020   K 3.6 04/09/2020   CO2 26 04/09/2020  GLUCOSE 116 (H) 04/09/2020   BUN 23 04/09/2020   CREATININE 1.33 (H) 04/09/2020   BILITOT 0.6 11/01/2019   ALKPHOS 75 10/30/2018   AST 17 11/01/2019   ALT 12 11/01/2019   PROT 7.0 11/01/2019   ALBUMIN 4.7 10/30/2018   CALCIUM 9.2 04/09/2020   ANIONGAP 13 04/09/2020   GFR 44.06 (L) 10/30/2018   Lab Results  Component Value Date   CHOL 246 (H) 11/01/2019   Lab Results  Component Value Date   HDL 96 11/01/2019   Lab Results  Component Value Date   LDLCALC 130 (H) 11/01/2019   Lab Results  Component Value Date   TRIG 96 11/01/2019   Lab Results  Component Value Date   CHOLHDL 2.6 11/01/2019   Lab Results  Component Value Date   HGBA1C 5.6 04/26/2020      Assessment & Plan:   Physical exam.  Patient had initial elevated blood pressure reading here and this did come down to 148/78 after rest.  She states her blood pressures have generally consistently been controlled by home readings.  She has hypertension and hyperlipidemia which are treated with medication.  We discussed the following health maintenance issues  -Continue with annual flu vaccine -Discussed fall prevention with handout given -Discussed mammography and she declines further mammograms -We suggest that she consider repeat bone density but she declines -Discussed Shingrix vaccine and she will consider checking on insurance coverage but is undecided at this time -Obtain screening labs as above -Pneumonia vaccines complete   No orders of the defined types were placed in this encounter.   Follow-up: Return in about 6 months (around 05/04/2021).    Evelena Peat, MD

## 2020-11-01 NOTE — Patient Instructions (Signed)
Consider Shingrix vaccine and check on coverage with insurance if interested  Consider repeat mammogram and bone density scan and let us know if you change your mind.

## 2020-11-20 ENCOUNTER — Other Ambulatory Visit: Payer: Self-pay | Admitting: Family Medicine

## 2020-11-20 ENCOUNTER — Encounter: Payer: Self-pay | Admitting: Family Medicine

## 2020-11-20 NOTE — Telephone Encounter (Signed)
Please advise 

## 2020-12-13 ENCOUNTER — Other Ambulatory Visit: Payer: Self-pay | Admitting: Family Medicine

## 2021-01-02 ENCOUNTER — Telehealth: Payer: Self-pay | Admitting: *Deleted

## 2021-01-02 NOTE — Telephone Encounter (Signed)
Pt declined AWV just had CPE

## 2021-02-21 ENCOUNTER — Other Ambulatory Visit: Payer: Self-pay | Admitting: Family Medicine

## 2021-03-21 ENCOUNTER — Other Ambulatory Visit: Payer: Self-pay

## 2021-03-21 ENCOUNTER — Ambulatory Visit (INDEPENDENT_AMBULATORY_CARE_PROVIDER_SITE_OTHER): Payer: Medicare HMO

## 2021-03-21 ENCOUNTER — Encounter: Payer: Self-pay | Admitting: Family Medicine

## 2021-03-21 ENCOUNTER — Ambulatory Visit (INDEPENDENT_AMBULATORY_CARE_PROVIDER_SITE_OTHER): Payer: Medicare HMO | Admitting: Family Medicine

## 2021-03-21 VITALS — BP 130/70 | HR 96 | Temp 97.6°F | Ht 65.0 in | Wt 122.4 lb

## 2021-03-21 DIAGNOSIS — M25552 Pain in left hip: Secondary | ICD-10-CM

## 2021-03-21 DIAGNOSIS — E785 Hyperlipidemia, unspecified: Secondary | ICD-10-CM | POA: Diagnosis not present

## 2021-03-21 DIAGNOSIS — I10 Essential (primary) hypertension: Secondary | ICD-10-CM | POA: Diagnosis not present

## 2021-03-21 NOTE — Patient Instructions (Signed)
We will get X-ray over-read  Will probably consider Sports Medicine referral to further assess.

## 2021-03-21 NOTE — Progress Notes (Signed)
Established Patient Office Visit  Subjective:  Patient ID: [name] [name], female    DOB: [birth year]  Age: 80 y.o. MRN: [id number]  CC:  Chief Complaint  Patient presents with   Hip Pain    Patient complains of left hip pain, x2 days, Tried Advil with little relief    HPI [name] [name] presents for left hip pain for about 6 months.  She first noted onset about 6 months ago but particularly worse around Saturday after walking.  She states that generally she does not have pain at night.  But after walking this seems to be a lot worse.  Location is somewhat lateral and somewhat anterior.  Denies any low back pain.  Denies any recent injuries.  She is taken Advil occasionally without any significant relief.  Feels more unstable with walking since onset.  Denies any significant appetite changes.  Her weight is down slightly as below.  Denies any lower extremity numbness.  No recent fever.  No urine or stool incontinence.  She has history of hypertension and hyperlipidemia.  No recent change in medications.  Wt Readings from Last 3 Encounters:  03/21/21 122 lb 6.4 oz (55.5 kg)  11/01/20 128 lb 14.4 oz (58.5 kg)  04/26/20 131 lb (59.4 kg)     Past Medical History:  Diagnosis Date   HYPERLIPIDEMIA 07/18/2008   HYPERTENSION 07/18/2008   OSTEOPOROSIS 07/25/2008    Past Surgical History:  Procedure Laterality Date   CARPAL TUNNEL RELEASE  05/2018   right hand   LUMBAR DISC SURGERY  1998    Family History  Problem Relation Age of Onset   Hyperlipidemia Mother    Hypertension Mother    Aneurysm Father    Stroke Father 46       cerebral aneurysm   Hyperlipidemia Sister    Hypertension Sister    Cancer Paternal Aunt        breast   Cancer Paternal Grandmother     Social History   Socioeconomic History   Marital status: Married    Spouse name: Not on file   Number of children: Not on file   Years of education: Not on file   Highest education level: Not on file  Occupational  History   Not on file  Tobacco Use   Smoking status: Never   Smokeless tobacco: Never  Vaping Use   Vaping Use: Never used  Substance and Sexual Activity   Alcohol use: Yes    Comment: WINE   Drug use: No   Sexual activity: Not on file  Other Topics Concern   Not on file  Social History Narrative   Not on file   Social Determinants of Health   Financial Resource Strain: Not on file  Food Insecurity: Not on file  Transportation Needs: Not on file  Physical Activity: Not on file  Stress: Not on file  Social Connections: Not on file  Intimate Partner Violence: Not on file    Outpatient Medications Prior to Visit  Medication Sig Dispense Refill   amLODipine (NORVASC) 5 MG tablet TAKE 1 TABLET BY MOUTH EVERY DAY 90 tablet 1   atorvastatin (LIPITOR) 20 MG tablet TAKE 1 TABLET BY MOUTH EVERY DAY 90 tablet 1   Calcium Citrate-Vitamin D (CITRACAL/VITAMIN D PO) Take 1 tablet by mouth daily.     losartan-hydrochlorothiazide (HYZAAR) 100-12.5 MG tablet TAKE 1 TABLET BY MOUTH EVERY DAY 90 tablet 1   ondansetron (ZOFRAN ODT) 8 MG disintegrating tablet Take 1  tablet (8 mg total) by mouth every 8 (eight) hours as needed for nausea or vomiting. 10 tablet 0   No facility-administered medications prior to visit.    No Known Allergies  ROS Review of Systems  Constitutional:  Negative for chills, fever and unexpected weight change.  Respiratory:  Negative for shortness of breath.   Cardiovascular:  Negative for chest pain.  Gastrointestinal:  Negative for abdominal pain.  Musculoskeletal:  Negative for back pain.  Neurological:  Negative for numbness.     Objective:    Physical Exam Vitals reviewed.  Constitutional:      Appearance: Normal appearance.  Cardiovascular:     Rate and Rhythm: Normal rate and regular rhythm.  Pulmonary:     Effort: Pulmonary effort is normal.     Breath sounds: Normal breath sounds.  Musculoskeletal:     Right lower leg: No edema.     Left lower  leg: No edema.     Comments: Straight leg raise are negative bilaterally.  No spinal tenderness.  She has fairly good range of motion left hip but does have some pain with external rotation.  No lateral hip tenderness over the bursa.  Neurological:     Mental Status: She is alert.     Comments: Deep tendon reflexes are 2+ knee and 1+ ankle bilaterally.  She does not have any obvious weakness with plantarflexion, dorsiflexion, or knee extension bilaterally.    BP 130/70 (BP Location: Left Arm, Patient Position: Sitting, Cuff Size: Normal)   Pulse 96   Temp 97.6 F (36.4 C) (Oral)   Ht 5\' 5"  (1.651 m)   Wt 122 lb 6.4 oz (55.5 kg)   SpO2 97%   BMI 20.37 kg/m  Wt Readings from Last 3 Encounters:  03/21/21 122 lb 6.4 oz (55.5 kg)  11/01/20 128 lb 14.4 oz (58.5 kg)  04/26/20 131 lb (59.4 kg)     Health Maintenance Due  Topic Date Due   Zoster Vaccines- Shingrix (1 of 2) Never done   DEXA SCAN  Never done   TETANUS/TDAP  05/27/2019   COVID-19 Vaccine (5 - Booster for Pfizer series) 09/28/2020    There are no preventive care reminders to display for this patient.  Lab Results  Component Value Date   TSH 3.95 11/01/2020   Lab Results  Component Value Date   WBC 5.4 11/01/2020   HGB 13.1 11/01/2020   HCT 38.1 11/01/2020   MCV 92.9 11/01/2020   PLT 258.0 11/01/2020   Lab Results  Component Value Date   NA 137 11/01/2020   K 4.2 11/01/2020   CO2 27 11/01/2020   GLUCOSE 121 (H) 11/01/2020   BUN 21 11/01/2020   CREATININE 1.33 (H) 11/01/2020   BILITOT 0.8 11/01/2020   ALKPHOS 78 11/01/2020   AST 17 11/01/2020   ALT 11 11/01/2020   PROT 7.0 11/01/2020   ALBUMIN 4.6 11/01/2020   CALCIUM 10.0 11/01/2020   ANIONGAP 13 04/09/2020   GFR 37.36 (L) 11/01/2020   Lab Results  Component Value Date   CHOL 237 (H) 11/01/2020   Lab Results  Component Value Date   HDL 84.80 11/01/2020   Lab Results  Component Value Date   LDLCALC 126 (H) 11/01/2020   Lab Results   Component Value Date   TRIG 134.0 11/01/2020   Lab Results  Component Value Date   CHOLHDL 3 11/01/2020   Lab Results  Component Value Date   HGBA1C 5.6 04/26/2020  Assessment & Plan:   #1  84-month history of progressive left hip pain.  Denies any injury.  Pain worse after ambulation.  Denies any low back pain.  Difficult to sort out whether this is primarily coming from the hip or back.  Nonfocal neuro exam.  -Start with plain films left hip.  Plain films show some degenerative changes of both hips but relatively symmetric.  No acute bony abnormality noted.  This will be over read. -We will wait for x-ray over read.  Consider sports medicine referral for further evaluation  #2 hypertension stable.  Continue losartan HCTZ and amlodipine   No orders of the defined types were placed in this encounter.   Follow-up: No follow-ups on file.    Evelena Peat, MD

## 2021-05-04 ENCOUNTER — Ambulatory Visit (INDEPENDENT_AMBULATORY_CARE_PROVIDER_SITE_OTHER): Payer: Medicare HMO | Admitting: Family Medicine

## 2021-05-04 VITALS — BP 142/70 | HR 105 | Temp 98.2°F | Wt 121.9 lb

## 2021-05-04 DIAGNOSIS — R7303 Prediabetes: Secondary | ICD-10-CM

## 2021-05-04 DIAGNOSIS — M810 Age-related osteoporosis without current pathological fracture: Secondary | ICD-10-CM | POA: Diagnosis not present

## 2021-05-04 DIAGNOSIS — E785 Hyperlipidemia, unspecified: Secondary | ICD-10-CM

## 2021-05-04 DIAGNOSIS — I10 Essential (primary) hypertension: Secondary | ICD-10-CM

## 2021-05-04 LAB — POCT GLYCOSYLATED HEMOGLOBIN (HGB A1C): Hemoglobin A1C: 5.7 % — AB (ref 4.0–5.6)

## 2021-05-04 NOTE — Progress Notes (Signed)
Established Patient Office Visit  Subjective:  Patient ID: [name] [name], female    DOB: [birth year]  Age: 81 y.o. MRN: [id number]  CC:  Chief Complaint  Patient presents with   Follow-up    Hypertension     HPI [name] [name] presents for medical follow-up.  She has hypertension treated with amlodipine and losartan HCTZ.  She does have chronic kidney disease with GFR fluctuating between 37 and 50.  Husband states that she does not drink a lot of water and they think she may be chronically somewhat dehydrated.  No recent dizziness.  She has hyperlipidemia treated with atorvastatin and tolerating well.  She had lipids done last summer which were stable.  We did note that she had had multiple blood sugars in the prediabetes range with most recent fasting glucose 121.  We recommend follow-up A1c at this time.  She is not aware of any family history of diabetes.  Her father though died at early age.  She has had no thirst or any significant urine frequency.  She had lost some weight from last summer to December but stable currently and good appetite  Years ago was treated with Fosamax.  She estimates this was well over 10 years ago.  She declines further DEXA scan at this time.  She does take some calcium and vitamin D daily  She complained of some hip pain last visit and x-ray showed only mild osteoarthritis.  We suspect her pain actually may be coming more from her back.  She states her pain is better at this time.  She is not walking a lot but hopes to increase this soon.  Past Medical History:  Diagnosis Date   HYPERLIPIDEMIA 07/18/2008   HYPERTENSION 07/18/2008   OSTEOPOROSIS 07/25/2008    Past Surgical History:  Procedure Laterality Date   CARPAL TUNNEL RELEASE  05/2018   right hand   LUMBAR DISC SURGERY  1998    Family History  Problem Relation Age of Onset   Hyperlipidemia Mother    Hypertension Mother    Aneurysm Father    Stroke Father 78       cerebral aneurysm    Hyperlipidemia Sister    Hypertension Sister    Cancer Paternal Aunt        breast   Cancer Paternal Grandmother     Social History   Socioeconomic History   Marital status: Married    Spouse name: Not on file   Number of children: Not on file   Years of education: Not on file   Highest education level: Not on file  Occupational History   Not on file  Tobacco Use   Smoking status: Never   Smokeless tobacco: Never  Vaping Use   Vaping Use: Never used  Substance and Sexual Activity   Alcohol use: Yes    Comment: WINE   Drug use: No   Sexual activity: Not on file  Other Topics Concern   Not on file  Social History Narrative   Not on file   Social Determinants of Health   Financial Resource Strain: Not on file  Food Insecurity: Not on file  Transportation Needs: Not on file  Physical Activity: Not on file  Stress: Not on file  Social Connections: Not on file  Intimate Partner Violence: Not on file    Outpatient Medications Prior to Visit  Medication Sig Dispense Refill   amLODipine (NORVASC) 5 MG tablet TAKE 1 TABLET BY MOUTH EVERY DAY 90  tablet 1   atorvastatin (LIPITOR) 20 MG tablet TAKE 1 TABLET BY MOUTH EVERY DAY 90 tablet 1   Calcium Citrate-Vitamin D (CITRACAL/VITAMIN D PO) Take 1 tablet by mouth daily.     losartan-hydrochlorothiazide (HYZAAR) 100-12.5 MG tablet TAKE 1 TABLET BY MOUTH EVERY DAY 90 tablet 1   No facility-administered medications prior to visit.    No Known Allergies  ROS Review of Systems  Constitutional:  Negative for fatigue and unexpected weight change.  Eyes:  Negative for visual disturbance.  Respiratory:  Negative for cough, chest tightness, shortness of breath and wheezing.   Cardiovascular:  Negative for chest pain, palpitations and leg swelling.  Endocrine: Negative for polydipsia and polyuria.  Genitourinary:  Negative for dysuria.  Neurological:  Negative for dizziness, seizures, syncope, weakness, light-headedness and  headaches.     Objective:    Physical Exam Constitutional:      Appearance: She is well-developed.  Eyes:     Pupils: Pupils are equal, round, and reactive to light.  Neck:     Thyroid: No thyromegaly.     Vascular: No JVD.  Cardiovascular:     Rate and Rhythm: Normal rate and regular rhythm.     Heart sounds:    No gallop.  Pulmonary:     Effort: Pulmonary effort is normal. No respiratory distress.     Breath sounds: Normal breath sounds. No wheezing or rales.  Musculoskeletal:     Cervical back: Neck supple.     Right lower leg: No edema.     Left lower leg: No edema.  Neurological:     Mental Status: She is alert.    BP (!) 142/70 (BP Location: Left Arm, Patient Position: Sitting, Cuff Size: Normal)    Pulse (!) 105    Temp 98.2 F (36.8 C) (Oral)    Wt 121 lb 14.4 oz (55.3 kg)    SpO2 97%    BMI 20.29 kg/m  Wt Readings from Last 3 Encounters:  05/04/21 121 lb 14.4 oz (55.3 kg)  03/21/21 122 lb 6.4 oz (55.5 kg)  11/01/20 128 lb 14.4 oz (58.5 kg)     Health Maintenance Due  Topic Date Due   Zoster Vaccines- Shingrix (1 of 2) Never done   DEXA SCAN  Never done   TETANUS/TDAP  05/27/2019   COVID-19 Vaccine (5 - Booster for Pfizer series) 09/28/2020    There are no preventive care reminders to display for this patient.  Lab Results  Component Value Date   TSH 3.95 11/01/2020   Lab Results  Component Value Date   WBC 5.4 11/01/2020   HGB 13.1 11/01/2020   HCT 38.1 11/01/2020   MCV 92.9 11/01/2020   PLT 258.0 11/01/2020   Lab Results  Component Value Date   NA 137 11/01/2020   K 4.2 11/01/2020   CO2 27 11/01/2020   GLUCOSE 121 (H) 11/01/2020   BUN 21 11/01/2020   CREATININE 1.33 (H) 11/01/2020   BILITOT 0.8 11/01/2020   ALKPHOS 78 11/01/2020   AST 17 11/01/2020   ALT 11 11/01/2020   PROT 7.0 11/01/2020   ALBUMIN 4.6 11/01/2020   CALCIUM 10.0 11/01/2020   ANIONGAP 13 04/09/2020   GFR 37.36 (L) 11/01/2020   Lab Results  Component Value Date    CHOL 237 (H) 11/01/2020   Lab Results  Component Value Date   HDL 84.80 11/01/2020   Lab Results  Component Value Date   LDLCALC 126 (H) 11/01/2020   Lab Results  Component Value Date   TRIG 134.0 11/01/2020   Lab Results  Component Value Date   CHOLHDL 3 11/01/2020   Lab Results  Component Value Date   HGBA1C 5.7 (A) 05/04/2021      Assessment & Plan:   #1 hypertension stable by home readings.  Consistent home readings low [id number] systolic.  She has consistently been higher here.  #2 history of prediabetes range blood sugars.  A1c today 5.7% which is stable.  Reassurance given  #3 hyperlipidemia treated with atorvastatin.  Continue atorvastatin and recheck lipids in 6 months.  She is tolerating this well with no side effects  #4 history of osteoporosis.  We did encourage her to consider repeat DEXA but she declines at this time.  She is not sure she would wish to go on any osteoporosis therapies whatsoever even if this were low.  She does agree to take vitamin D and calcium daily   No orders of the defined types were placed in this encounter.   Follow-up: Return in about 6 months (around 11/01/2021).    Carolann Littler, MD

## 2021-05-04 NOTE — Patient Instructions (Signed)
Consider DEXA (bone density scan) and let me know if you change your mind  Recommend daily calcium 1,200 mg and Vit D 800 to 1,000 IU per day.    A1C was 5.7 which is normal.

## 2021-05-23 ENCOUNTER — Other Ambulatory Visit: Payer: Self-pay | Admitting: Family Medicine

## 2021-08-23 ENCOUNTER — Other Ambulatory Visit: Payer: Self-pay | Admitting: Family Medicine

## 2021-08-31 ENCOUNTER — Encounter: Payer: Self-pay | Admitting: Family Medicine

## 2021-11-02 ENCOUNTER — Encounter: Payer: Self-pay | Admitting: Family Medicine

## 2021-11-02 ENCOUNTER — Ambulatory Visit (INDEPENDENT_AMBULATORY_CARE_PROVIDER_SITE_OTHER): Payer: Medicare HMO | Admitting: Family Medicine

## 2021-11-02 VITALS — BP 128/60 | HR 100 | Temp 97.7°F | Ht 65.0 in | Wt 120.1 lb

## 2021-11-02 DIAGNOSIS — I10 Essential (primary) hypertension: Secondary | ICD-10-CM | POA: Diagnosis not present

## 2021-11-02 DIAGNOSIS — R7303 Prediabetes: Secondary | ICD-10-CM | POA: Diagnosis not present

## 2021-11-02 DIAGNOSIS — E785 Hyperlipidemia, unspecified: Secondary | ICD-10-CM

## 2021-11-02 LAB — LIPID PANEL
Cholesterol: 249 mg/dL — ABNORMAL HIGH (ref 0–200)
HDL: 99.5 mg/dL (ref >39.00)
LDL Cholesterol: 127 mg/dL — ABNORMAL HIGH (ref 0–99)
NonHDL: 149.32
Total CHOL/HDL Ratio: 3
Triglycerides: 113 mg/dL (ref 0.0–149.0)
VLDL: 22.6 mg/dL (ref 0.0–40.0)

## 2021-11-02 LAB — HEMOGLOBIN A1C: Hgb A1c MFr Bld: 5.9 % (ref 4.6–6.5)

## 2021-11-02 LAB — BASIC METABOLIC PANEL
BUN: 19 mg/dL (ref 6–23)
CO2: 30 mEq/L (ref 19–32)
Calcium: 10 mg/dL (ref 8.4–10.5)
Chloride: 95 mEq/L — ABNORMAL LOW (ref 96–112)
Creatinine, Ser: 1.32 mg/dL — ABNORMAL HIGH (ref 0.40–1.20)
GFR: 37.44 mL/min — ABNORMAL LOW (ref >60.00)
Glucose, Bld: 121 mg/dL — ABNORMAL HIGH (ref 70–99)
Potassium: 4.2 mEq/L (ref 3.5–5.1)
Sodium: 135 mEq/L (ref 135–145)

## 2021-11-02 LAB — HEPATIC FUNCTION PANEL
ALT: 12 U/L (ref 0–35)
AST: 19 U/L (ref 0–37)
Albumin: 4.7 g/dL (ref 3.5–5.2)
Alkaline Phosphatase: 79 U/L (ref 39–117)
Bilirubin, Direct: 0.2 mg/dL (ref 0.0–0.3)
Total Bilirubin: 0.9 mg/dL (ref 0.2–1.2)
Total Protein: 7.5 g/dL (ref 6.0–8.3)

## 2021-11-02 MED ORDER — LOSARTAN POTASSIUM-HCTZ 100-12.5 MG PO TABS: 1.0000 | ORAL_TABLET | Freq: Every day | ORAL | 3 refills | Status: DC

## 2021-11-02 NOTE — Progress Notes (Signed)
Established Patient Office Visit  Subjective   Patient ID: [name] [name], female    DOB: Jul 06, 1940  Age: 81 y.o. MRN: [id number]  Chief Complaint  Patient presents with   Follow-up    HPI   [name] [name] is here for routine medical follow-up.  She has history of hypertension, hyperlipidemia, prediabetes.  Generally doing well.  No recent falls.  Remains on amlodipine, atorvastatin, and losartan HCTZ.  Needs refills of losartan.  Home blood pressures generally ranging 90s to 100 systolic and 60s diastolic.  No recent orthostatic symptoms.  No dizziness.  No lightheadedness.  No chest pains.  Appetite and weight stable. Due for follow-up labs.  Past Medical History:  Diagnosis Date   HYPERLIPIDEMIA 07/18/2008   HYPERTENSION 07/18/2008   OSTEOPOROSIS 07/25/2008   Past Surgical History:  Procedure Laterality Date   CARPAL TUNNEL RELEASE  05/2018   right hand   LUMBAR DISC SURGERY  1998    reports that she has never smoked. She has never used smokeless tobacco. She reports current alcohol use. She reports that she does not use drugs. family history includes Aneurysm in her father; Cancer in her paternal aunt and paternal grandmother; Hyperlipidemia in her mother and sister; Hypertension in her mother and sister; Stroke (age of onset: 42) in her father. No Known Allergies]  Review of Systems  Constitutional:  Negative for malaise/fatigue.  Eyes:  Negative for blurred vision.  Respiratory:  Negative for shortness of breath.   Cardiovascular:  Negative for chest pain.  Gastrointestinal:  Negative for nausea and vomiting.  Genitourinary:  Negative for dysuria.  Neurological:  Negative for dizziness, weakness and headaches.      Objective:     BP 128/60 (BP Location: Left Arm, Cuff Size: Normal)   Pulse 100   Temp 97.7 F (36.5 C) (Oral)   Ht 5\' 5"  (1.651 m)   Wt 120 lb 1.6 oz (54.5 kg)   SpO2 98%   BMI 19.99 kg/m    Physical Exam Constitutional:      Appearance: She is  well-developed.  Eyes:     Pupils: Pupils are equal, round, and reactive to light.  Neck:     Thyroid: No thyromegaly.     Vascular: No JVD.  Cardiovascular:     Rate and Rhythm: Normal rate and regular rhythm.     Heart sounds:     No gallop.  Pulmonary:     Effort: Pulmonary effort is normal. No respiratory distress.     Breath sounds: Normal breath sounds. No wheezing or rales.  Musculoskeletal:     Cervical back: Neck supple.     Right lower leg: No edema.     Left lower leg: No edema.  Neurological:     Mental Status: She is alert.      No results found for any visits on 11/02/21.    The ASCVD Risk score (Arnett DK, et al., 2019) failed to calculate for the following reasons:   The 2019 ASCVD risk score is only valid for ages 63 to 41    Assessment & Plan:   Problem List Items Addressed This Visit       Unprioritized   Prediabetes   Relevant Orders   Hemoglobin A1c   Essential hypertension - Primary   Relevant Medications   losartan-hydrochlorothiazide (HYZAAR) 100-12.5 MG tablet   Other Relevant Orders   Basic metabolic panel   Hyperlipidemia   Relevant Medications   losartan-hydrochlorothiazide (HYZAAR) 100-12.5 MG tablet  Other Relevant Orders   Lipid panel   Hepatic function panel  -Obtain labs as above -Refill losartan HCTZ for 1 year -Stressed importance of staying well-hydrated -Follow-up for any dizziness or other concerns -Set up routine follow-up in 6 months  Return in about 6 months (around 05/05/2022).    Evelena Peat, MD

## 2021-11-07 ENCOUNTER — Encounter: Payer: Self-pay | Admitting: Family Medicine

## 2021-11-23 ENCOUNTER — Other Ambulatory Visit: Payer: Self-pay | Admitting: Family Medicine

## 2021-11-28 ENCOUNTER — Other Ambulatory Visit: Payer: Self-pay | Admitting: Family Medicine

## 2022-01-18 ENCOUNTER — Ambulatory Visit (INDEPENDENT_AMBULATORY_CARE_PROVIDER_SITE_OTHER): Payer: Medicare HMO

## 2022-01-18 DIAGNOSIS — Z23 Encounter for immunization: Secondary | ICD-10-CM

## 2022-01-30 ENCOUNTER — Telehealth: Payer: Self-pay | Admitting: *Deleted

## 2022-01-30 ENCOUNTER — Encounter: Payer: Self-pay | Admitting: *Deleted

## 2022-01-30 NOTE — Patient Outreach (Signed)
  Care Coordination   Initial Visit Note   01/30/2022 Name: [name] [name] MRN: [id number] DOB: [birth year]  [name] [name] is a 81 y.o. year old female who sees Burchette, Alinda Sierras, MD for primary care. I spoke with  [name] [name] by phone today.  What matters to the patients health and wellness today?  No needs    Goals Addressed               This Visit's Progress     COMPLETED: No needs (pt-stated)        Care Coordination Interventions: Advised patient to contact her provider's office and scheduled her AWV for 2023 (declined this option) Reviewed medications with patient and discussed adherence with no needed refills at this time Reviewed scheduled/upcoming provider appointments including pending appointments Screening for signs and symptoms of depression related to chronic disease state  Assessed social determinant of health barriers          SDOH assessments and interventions completed:  Yes  SDOH Interventions Today    Flowsheet Row Most Recent Value  SDOH Interventions   Food Insecurity Interventions Intervention Not Indicated  Housing Interventions Intervention Not Indicated  Transportation Interventions Intervention Not Indicated  Utilities Interventions Intervention Not Indicated        Care Coordination Interventions Activated:  Yes  Care Coordination Interventions:  Yes, provided   Follow up plan: No further intervention required.   Encounter Outcome:  Pt. Visit Completed   Raina Mina, RN Care Management Coordinator [place] Office [phone]

## 2022-01-30 NOTE — Patient Instructions (Signed)
Visit Information  Thank you for taking time to visit with me today. Please don't hesitate to contact me if I can be of assistance to you.   Following are the goals we discussed today:   Goals Addressed               This Visit's Progress     COMPLETED: No needs (pt-stated)        Care Coordination Interventions: Advised patient to contact her provider's office and scheduled her AWV for 2023 (declined this option) Reviewed medications with patient and discussed adherence with no needed refills at this time Reviewed scheduled/upcoming provider appointments including pending appointments Screening for signs and symptoms of depression related to chronic disease state  Assessed social determinant of health barriers          Please call the care guide team at [phone] if you need to cancel or reschedule your appointment.   If you are experiencing a Mental Health or [place] or need someone to talk to, please call the Suicide and Crisis Lifeline: 988  Patient verbalizes understanding of instructions and care plan provided today and agrees to view in [place]. Active MyChart status and patient understanding of how to access instructions and care plan via MyChart confirmed with patient.     No further follow up required: No needs  Raina Mina, RN Care Management Coordinator [place] Office [phone]

## 2022-02-23 ENCOUNTER — Other Ambulatory Visit: Payer: Self-pay | Admitting: Family Medicine

## 2022-02-25 ENCOUNTER — Other Ambulatory Visit: Payer: Self-pay | Admitting: Family Medicine

## 2022-05-06 ENCOUNTER — Ambulatory Visit (INDEPENDENT_AMBULATORY_CARE_PROVIDER_SITE_OTHER): Payer: Medicare HMO | Admitting: Family Medicine

## 2022-05-06 ENCOUNTER — Encounter: Payer: Self-pay | Admitting: Family Medicine

## 2022-05-06 VITALS — BP 140/80 | HR 110 | Temp 97.9°F | Ht 65.0 in | Wt 112.2 lb

## 2022-05-06 DIAGNOSIS — R7303 Prediabetes: Secondary | ICD-10-CM | POA: Diagnosis not present

## 2022-05-06 DIAGNOSIS — E785 Hyperlipidemia, unspecified: Secondary | ICD-10-CM

## 2022-05-06 DIAGNOSIS — N1832 Chronic kidney disease, stage 3b: Secondary | ICD-10-CM | POA: Diagnosis not present

## 2022-05-06 DIAGNOSIS — N183 Chronic kidney disease, stage 3 unspecified: Secondary | ICD-10-CM | POA: Insufficient documentation

## 2022-05-06 DIAGNOSIS — I10 Essential (primary) hypertension: Secondary | ICD-10-CM

## 2022-05-06 NOTE — Progress Notes (Signed)
Established Patient Office Visit  Subjective   Patient ID: [name] [name], female    DOB: 15-Oct-1940  Age: 82 y.o. MRN: [id number]  No chief complaint on file.   HPI   [name] [name] is seen for medical follow-up.  She has history of hypertension treated with amlodipine 5 mg daily and losartan/HCTZ 100/12.5 mg 1 daily.  She states her blood pressures at home are usually around 140/80 in the morning but then much better later in the day with consistent readings in the low 767M systolic and usually 09O or 70J diastolic.  No consistent orthostatic symptoms.  Denies recent falls.  No peripheral edema.  She has hyperlipidemia treated with atorvastatin 20 mg daily.  No myalgias.  Even with statin her total cholesterols been elevated but she has excellent HDL with most recent HDL of 99.  She has history of prediabetes range blood sugars.  No polyuria or polydipsia.  Last A1c 5.9%.  She does have history of chronic kidney disease stage III.  No regular nonsteroidal use.  Their daughter was diagnosed with ovarian cancer last year and is doing very well with treatment.  They have been encouraged by her progress.  Past Medical History:  Diagnosis Date   HYPERLIPIDEMIA 07/18/2008   HYPERTENSION 07/18/2008   OSTEOPOROSIS 07/25/2008   Past Surgical History:  Procedure Laterality Date   CARPAL TUNNEL RELEASE  05/2018   right hand   [place]    reports that she has never smoked. She has never used smokeless tobacco. She reports current alcohol use. She reports that she does not use drugs. family history includes Aneurysm in her father; Cancer in her paternal aunt and paternal grandmother; Hyperlipidemia in her mother and sister; Hypertension in her mother and sister; Stroke (age of onset: 16) in her father. No Known Allergies  Review of Systems  Constitutional:  Negative for chills, fever and malaise/fatigue.  Eyes:  Negative for blurred vision.  Respiratory:  Negative for shortness of  breath.   Cardiovascular:  Negative for chest pain.  Gastrointestinal:  Negative for abdominal pain.  Neurological:  Negative for dizziness, weakness and headaches.      Objective:     BP (!) 140/80 (BP Location: Left Arm, Patient Position: Sitting, Cuff Size: Normal)   Pulse (!) 110   Temp 97.9 F (36.6 C) (Oral)   Ht 5\' 5"  (1.651 m)   Wt 112 lb 3.2 oz (50.9 kg)   SpO2 95%   BMI 18.67 kg/m  BP Readings from Last 3 Encounters:  05/06/22 (!) 140/80  11/02/21 128/60  05/04/21 (!) 142/70   Wt Readings from Last 3 Encounters:  05/06/22 112 lb 3.2 oz (50.9 kg)  11/02/21 120 lb 1.6 oz (54.5 kg)  05/04/21 121 lb 14.4 oz (55.3 kg)      Physical Exam Vitals reviewed.  Constitutional:      Appearance: She is well-developed.  Eyes:     Pupils: Pupils are equal, round, and reactive to light.  Neck:     Thyroid: No thyromegaly.     Vascular: No JVD.  Cardiovascular:     Rate and Rhythm: Normal rate and regular rhythm.     Heart sounds:     No gallop.  Pulmonary:     Effort: Pulmonary effort is normal. No respiratory distress.     Breath sounds: Normal breath sounds. No wheezing or rales.  Musculoskeletal:     Cervical back: Neck supple.     Right lower  leg: No edema.     Left lower leg: No edema.  Neurological:     Mental Status: She is alert.      No results found for any visits on 05/06/22.  Last CBC Lab Results  Component Value Date   WBC 5.4 11/01/2020   HGB 13.1 11/01/2020   HCT 38.1 11/01/2020   MCV 92.9 11/01/2020   MCH 32.1 04/09/2020   RDW 13.5 11/01/2020   PLT 258.0 62/70/3500   Last metabolic panel Lab Results  Component Value Date   GLUCOSE 121 (H) 11/02/2021   NA 135 11/02/2021   K 4.2 11/02/2021   CL 95 (L) 11/02/2021   CO2 30 11/02/2021   BUN 19 11/02/2021   CREATININE 1.32 (H) 11/02/2021   GFRNONAA 40 (L) 04/09/2020   CALCIUM 10.0 11/02/2021   PROT 7.5 11/02/2021   ALBUMIN 4.7 11/02/2021   BILITOT 0.9 11/02/2021   ALKPHOS 79  11/02/2021   AST 19 11/02/2021   ALT 12 11/02/2021   ANIONGAP 13 04/09/2020   Last lipids Lab Results  Component Value Date   CHOL 249 (H) 11/02/2021   HDL 99.50 11/02/2021   LDLCALC 127 (H) 11/02/2021   LDLDIRECT 116.6 06/14/2013   TRIG 113.0 11/02/2021   CHOLHDL 3 11/02/2021   Last hemoglobin A1c Lab Results  Component Value Date   HGBA1C 5.9 11/02/2021      The ASCVD Risk score (Arnett DK, et al., 2019) failed to calculate for the following reasons:   The 2019 ASCVD risk score is only valid for ages 69 to 1    Assessment & Plan:   #1 hypertension.  Very well-controlled by home readings.  Slightly up today but she had consistently better control later day by a log they bring in today.  Continue amlodipine 5 mg daily and losartan/HCTZ 100/12.5 mg 1 daily.  Continue low-sodium diet.  #2 hyperlipidemia treated with atorvastatin.  Continue atorvastatin 20 mg daily.  Recheck lipid and hepatic panel in 29-month follow-up  #3 history of prediabetes range blood sugars.  Continue low glycemic diet.  Reassess A1c at 43-month follow-up  #4 stage III chronic kidney disease.  Stressed avoidance of regular nonsteroidals.  Recheck basic metabolic panel at 3-month follow-up  Return in about 6 months (around 11/04/2022).    [name] Littler, MD

## 2022-05-18 ENCOUNTER — Other Ambulatory Visit: Payer: Self-pay | Admitting: Family Medicine

## 2022-08-20 ENCOUNTER — Other Ambulatory Visit: Payer: Self-pay | Admitting: Family Medicine

## 2022-11-04 ENCOUNTER — Ambulatory Visit (INDEPENDENT_AMBULATORY_CARE_PROVIDER_SITE_OTHER): Payer: Medicare HMO | Admitting: Family Medicine

## 2022-11-04 ENCOUNTER — Encounter: Payer: Self-pay | Admitting: Family Medicine

## 2022-11-04 ENCOUNTER — Telehealth: Payer: Self-pay | Admitting: Family Medicine

## 2022-11-04 VITALS — BP 130/68 | HR 100 | Temp 97.7°F | Ht 65.0 in | Wt 111.9 lb

## 2022-11-04 DIAGNOSIS — E785 Hyperlipidemia, unspecified: Secondary | ICD-10-CM | POA: Diagnosis not present

## 2022-11-04 DIAGNOSIS — R7303 Prediabetes: Secondary | ICD-10-CM | POA: Diagnosis not present

## 2022-11-04 DIAGNOSIS — N1832 Chronic kidney disease, stage 3b: Secondary | ICD-10-CM | POA: Diagnosis not present

## 2022-11-04 DIAGNOSIS — I10 Essential (primary) hypertension: Secondary | ICD-10-CM | POA: Diagnosis not present

## 2022-11-04 LAB — HEPATIC FUNCTION PANEL
ALT: 12 U/L (ref 0–35)
AST: 19 U/L (ref 0–37)
Albumin: 4.6 g/dL (ref 3.5–5.2)
Alkaline Phosphatase: 79 U/L (ref 39–117)
Bilirubin, Direct: 0.2 mg/dL (ref 0.0–0.3)
Total Bilirubin: 0.9 mg/dL (ref 0.2–1.2)
Total Protein: 7.3 g/dL (ref 6.0–8.3)

## 2022-11-04 LAB — HEMOGLOBIN A1C: Hgb A1c MFr Bld: 5.9 % (ref 4.6–6.5)

## 2022-11-04 LAB — BASIC METABOLIC PANEL
BUN: 21 mg/dL (ref 6–23)
CO2: 28 mEq/L (ref 19–32)
Calcium: 10.3 mg/dL (ref 8.4–10.5)
Chloride: 96 mEq/L (ref 96–112)
Creatinine, Ser: 1.34 mg/dL — ABNORMAL HIGH (ref 0.40–1.20)
GFR: 36.51 mL/min — ABNORMAL LOW (ref >60.00)
Glucose, Bld: 123 mg/dL — ABNORMAL HIGH (ref 70–99)
Potassium: 4.5 mEq/L (ref 3.5–5.1)
Sodium: 136 mEq/L (ref 135–145)

## 2022-11-04 LAB — LIPID PANEL
Cholesterol: 229 mg/dL — ABNORMAL HIGH (ref 0–200)
HDL: 94.2 mg/dL (ref >39.00)
LDL Cholesterol: 113 mg/dL — ABNORMAL HIGH (ref 0–99)
NonHDL: 134.87
Total CHOL/HDL Ratio: 2
Triglycerides: 108 mg/dL (ref 0.0–149.0)
VLDL: 21.6 mg/dL (ref 0.0–40.0)

## 2022-11-04 NOTE — Telephone Encounter (Signed)
Pt called, returning CMA's call regarding labs. CMA was with a patient. Pt asked that CMA call back at his earliest convenience.

## 2022-11-04 NOTE — Telephone Encounter (Signed)
Please see result note 

## 2022-11-04 NOTE — Progress Notes (Signed)
Established Patient Office Visit  Subjective   Patient ID: [name] [name], female    DOB: [birth year]  Age: 82 y.o. MRN: [id number]  Chief Complaint  Patient presents with   Medical Management of Chronic Issues    HPI   [name] [name] is here for 67-month follow-up.  She has history of hypertension, hyperlipidemia, prediabetes range A1c, and chronic kidney disease stage III.  She has occasional arthritic complaints mostly takes Tylenol for that.  She knows to avoid nonsteroidals.  Blood pressure treated with amlodipine and losartan/HCTZ.  Home blood pressure generally well-controlled.  Occasional high of systolic 140.  No recent falls.  No chest pains.  Hyperlipidemia treated with atorvastatin 20 mg daily.  Needs follow-up labs.  Past Medical History:  Diagnosis Date   HYPERLIPIDEMIA 07/18/2008   HYPERTENSION 07/18/2008   OSTEOPOROSIS 07/25/2008   Past Surgical History:  Procedure Laterality Date   CARPAL TUNNEL RELEASE  05/2018   right hand   LUMBAR DISC SURGERY  1998    reports that she has never smoked. She has never used smokeless tobacco. She reports current alcohol use. She reports that she does not use drugs. family history includes Aneurysm in her father; Cancer in her paternal aunt and paternal grandmother; Hyperlipidemia in her mother and sister; Hypertension in her mother and sister; Stroke (age of onset: 26) in her father. No Known Allergies  Review of Systems  Constitutional:  Negative for chills, fever and malaise/fatigue.  Eyes:  Negative for blurred vision.  Respiratory:  Negative for shortness of breath.   Cardiovascular:  Negative for chest pain.  Neurological:  Negative for dizziness, weakness and headaches.      Objective:     BP 130/68 (BP Location: Left Arm, Cuff Size: Normal)   Pulse 100   Temp 97.7 F (36.5 C) (Oral)   Ht 5\' 5"  (1.651 m)   Wt 111 lb 14.4 oz (50.8 kg)   SpO2 96%   BMI 18.62 kg/m  BP Readings from Last 3 Encounters:  11/04/22 130/68   05/06/22 (!) 140/80  11/02/21 128/60   Wt Readings from Last 3 Encounters:  11/04/22 111 lb 14.4 oz (50.8 kg)  05/06/22 112 lb 3.2 oz (50.9 kg)  11/02/21 120 lb 1.6 oz (54.5 kg)      Physical Exam Vitals reviewed.  Constitutional:      Appearance: She is well-developed.  HENT:     Head: Normocephalic and atraumatic.  Neck:     Thyroid: No thyromegaly.     Vascular: No JVD.  Cardiovascular:     Rate and Rhythm: Normal rate and regular rhythm.     Heart sounds:     No gallop.  Pulmonary:     Effort: Pulmonary effort is normal. No respiratory distress.     Breath sounds: Normal breath sounds. No wheezing or rales.  Musculoskeletal:     Cervical back: Neck supple.     Right lower leg: No edema.     Left lower leg: No edema.  Neurological:     General: No focal deficit present.     Mental Status: She is alert.      No results found for any visits on 11/04/22.    The ASCVD Risk score (Arnett DK, et al., 2019) failed to calculate for the following reasons:   The 2019 ASCVD risk score is only valid for ages 39 to 32    Assessment & Plan:   Problem List Items Addressed This Visit  Unprioritized   CKD (chronic kidney disease) stage 3, GFR 30-59 ml/min (HCC)   Relevant Orders   Basic metabolic panel   Prediabetes   Relevant Orders   Hemoglobin A1c   Hyperlipidemia   Relevant Orders   Lipid panel   Hepatic function panel   Essential hypertension - Primary   Relevant Orders   Basic metabolic panel  Multiple chronic problems as above.  We discussed follow-up labs as above.  Her blood pressures have been well-controlled by home readings and standing blood pressure today 130/68.  Continue amlodipine 5 mg daily and losartan/HCTZ 100/12.5 mg daily.  Handout on DASH diet given.  Discussed healthy diet and importance of getting adequate protein. Reminder for annual flu vaccine in the fall Routine follow-up in 6 months and sooner as needed  Return in about 6  months (around 05/07/2023).    Evelena Peat, MD

## 2022-11-09 ENCOUNTER — Other Ambulatory Visit: Payer: Self-pay | Admitting: Family Medicine

## 2023-01-06 ENCOUNTER — Ambulatory Visit (INDEPENDENT_AMBULATORY_CARE_PROVIDER_SITE_OTHER): Payer: Medicare HMO

## 2023-01-06 DIAGNOSIS — Z23 Encounter for immunization: Secondary | ICD-10-CM | POA: Diagnosis not present

## 2023-02-08 ENCOUNTER — Other Ambulatory Visit: Payer: Self-pay | Admitting: Family Medicine

## 2023-05-06 ENCOUNTER — Other Ambulatory Visit: Payer: Self-pay | Admitting: Family Medicine

## 2023-05-13 ENCOUNTER — Other Ambulatory Visit: Payer: Self-pay | Admitting: Family Medicine

## 2023-07-01 ENCOUNTER — Ambulatory Visit (INDEPENDENT_AMBULATORY_CARE_PROVIDER_SITE_OTHER): Payer: Medicare HMO | Admitting: Family Medicine

## 2023-07-01 ENCOUNTER — Encounter: Payer: Self-pay | Admitting: Family Medicine

## 2023-07-01 VITALS — BP 142/72 | HR 97 | Temp 97.4°F | Ht 65.0 in | Wt 112.1 lb

## 2023-07-01 DIAGNOSIS — E785 Hyperlipidemia, unspecified: Secondary | ICD-10-CM | POA: Diagnosis not present

## 2023-07-01 DIAGNOSIS — I10 Essential (primary) hypertension: Secondary | ICD-10-CM | POA: Diagnosis not present

## 2023-07-01 DIAGNOSIS — F0390 Unspecified dementia without behavioral disturbance: Secondary | ICD-10-CM | POA: Insufficient documentation

## 2023-07-01 DIAGNOSIS — R7303 Prediabetes: Secondary | ICD-10-CM

## 2023-07-01 DIAGNOSIS — N1832 Chronic kidney disease, stage 3b: Secondary | ICD-10-CM

## 2023-07-01 DIAGNOSIS — F03B0 Unspecified dementia, moderate, without behavioral disturbance, psychotic disturbance, mood disturbance, and anxiety: Secondary | ICD-10-CM | POA: Diagnosis not present

## 2023-07-01 NOTE — Progress Notes (Signed)
 Established Patient Office Visit  Subjective   Patient ID: [name] [name], female    DOB: [birth year]  Age: 83 y.o. MRN: [id number]  Chief Complaint  Patient presents with   Medical Management of Chronic Issues    6 month follow-up, for BP check     HPI   [name] [name] is here with husband for medical follow-up.  She has history of hypertension, osteoporosis, chronic kidney disease, hyperlipidemia, prediabetes.  Generally doing well although her short-term memory deficits have progressed.  They have declined further testing such as MRI scan in the past.  Also declining medication.  Some question as to whether her mom may have had dementia.  Patient no longer drives.  No longer cooks.  Does not have any agitation or behavioral disturbance.  Husband is very devoted to her care.  He is assisting her with multiple ADLs.  She does have history of hypertension treated with amlodipine and losartan HCTZ.  She also has chronic kidney disease.  They avoid nonsteroidals.  Takes Tylenol for pain as needed.  On atorvastatin and had his lipids last July along with other labs.  Has been states she gets very agitated and anxious with any sort of testing and they declined any further health maintenance testing such as mammograms  Past Medical History:  Diagnosis Date   HYPERLIPIDEMIA 07/18/2008   HYPERTENSION 07/18/2008   OSTEOPOROSIS 07/25/2008   Past Surgical History:  Procedure Laterality Date   CARPAL TUNNEL RELEASE  05/2018   right hand   LUMBAR DISC SURGERY  1998    reports that she has never smoked. She has never used smokeless tobacco. She reports current alcohol use. She reports that she does not use drugs. family history includes Aneurysm in her father; Cancer in her paternal aunt and paternal grandmother; Hyperlipidemia in her mother and sister; Hypertension in her mother and sister; Stroke (age of onset: 34) in her father. No Known Allergies  Review of Systems  Constitutional:  Negative for  chills, fever, malaise/fatigue and weight loss.  Eyes:  Negative for blurred vision.  Respiratory:  Negative for shortness of breath.   Cardiovascular:  Negative for chest pain.  Neurological:  Negative for dizziness, weakness and headaches.      Objective:     BP (!) 142/72 (BP Location: Left Arm, Cuff Size: Normal)   Pulse 97   Temp (!) 97.4 F (36.3 C) (Oral)   Ht 5\' 5"  (1.651 m)   Wt 112 lb 1.6 oz (50.8 kg)   SpO2 96%   BMI 18.65 kg/m    Physical Exam Vitals reviewed.  Constitutional:      General: She is not in acute distress. Cardiovascular:     Rate and Rhythm: Normal rate and regular rhythm.  Pulmonary:     Effort: Pulmonary effort is normal.     Breath sounds: Normal breath sounds. No wheezing or rales.  Musculoskeletal:     Right lower leg: No edema.     Left lower leg: No edema.  Neurological:     General: No focal deficit present.     Mental Status: She is alert.  Psychiatric:     Comments: Disoriented to day of week, season, month, year.  0 out of 3 3 word recall.  Struggled tremendously with clock drawing.      No results found for any visits on 07/01/23.    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  Severe deficits with short-term memory.  We discussed further testing including MRI scan and further cognitive testing and they declined.  We also discussed possible medication such as Namenda and Aricept and they declined.  Fortunately, she has no significant behavioral disturbance.  Husband very devoted to her care.  Handout given on Alzheimer's caregiver guide. -Discussed safety issues.  Thankfully, she is no longer driving  #2 hypertension.  Slightly up today but generally better controlled at home.  Continue periodic monitoring.  Try to keep daily sodium intake less than  2500 mg  #3 chronic kidney disease stage IIIb.  Avoid all nonsteroidals.  Stressed importance of adequate hydration.  Recheck at follow-up  #4 hyperlipidemia treated with atorvastatin.  Recheck lipids and liver panel at follow-up in 4 to 6 months   Return in about 6 months (around 01/01/2024).    Evelena Peat, MD

## 2023-07-01 NOTE — Patient Instructions (Signed)
 Goal daily sodium < 2,000 mg

## 2023-08-14 ENCOUNTER — Other Ambulatory Visit: Payer: Self-pay | Admitting: Family Medicine

## 2023-09-08 IMAGING — DX DG HIP (WITH OR WITHOUT PELVIS) 2-3V*L*
3 series · 3 of 3 positions shown · non-contrast
Comparison: None.

CLINICAL DATA: Progressive left hip pain for 6 months, no history
of injury

EXAM:
DG HIP (WITH OR WITHOUT PELVIS) 2-3V LEFT

[pelvis ap]
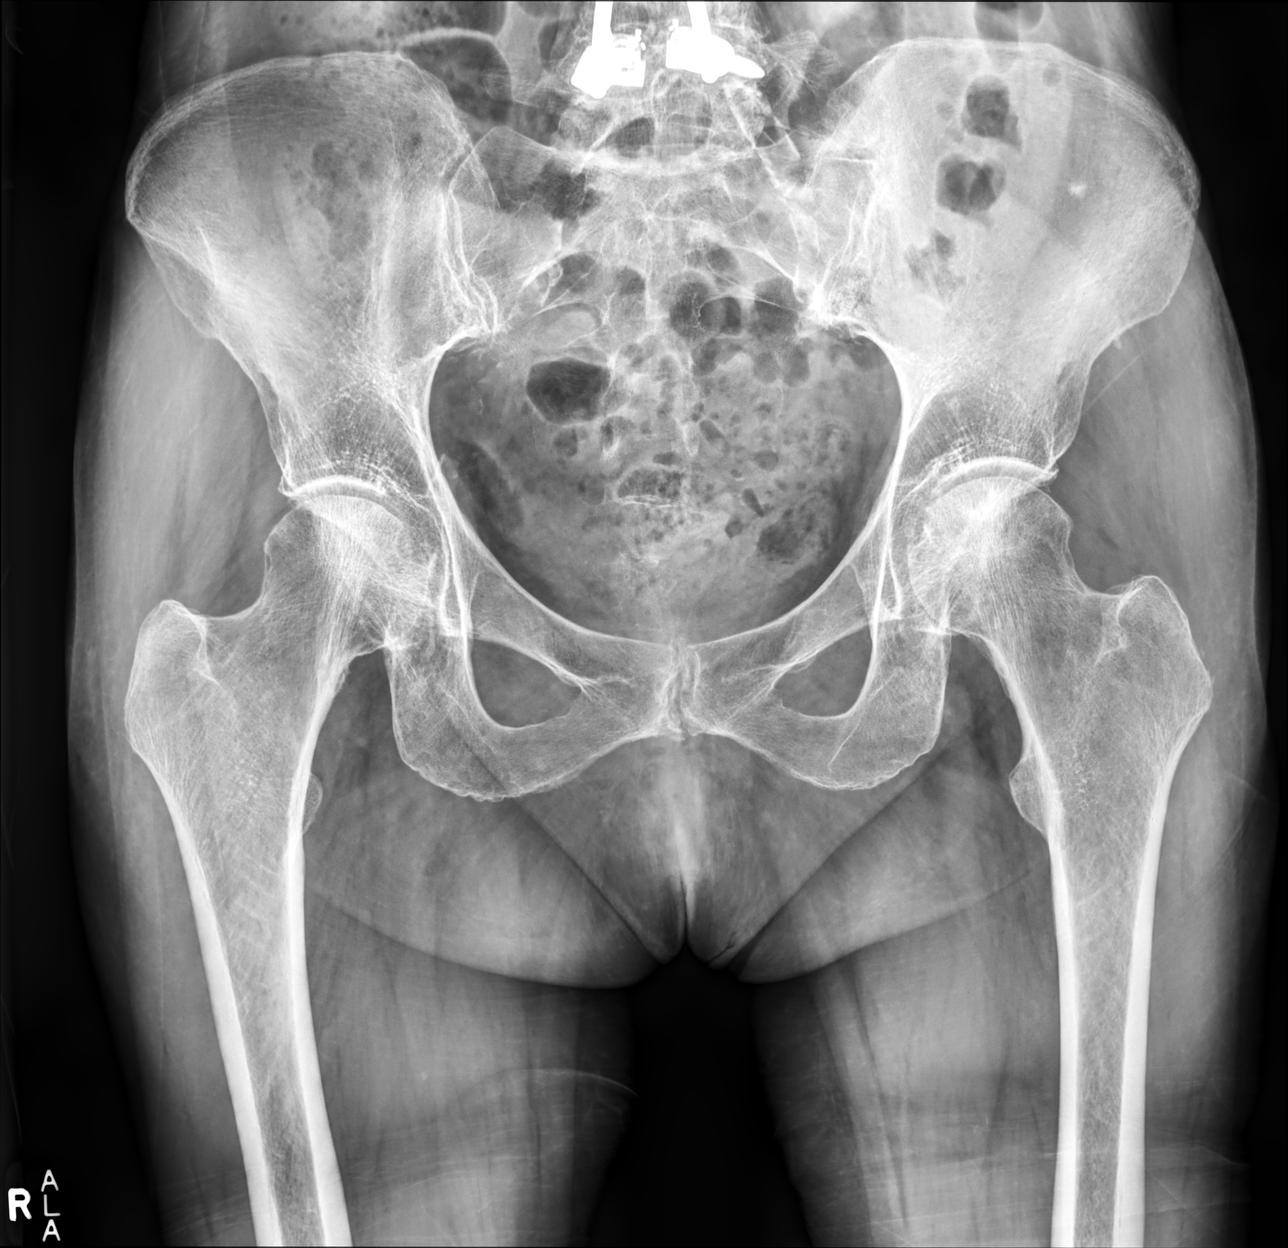

[hip joint ap]
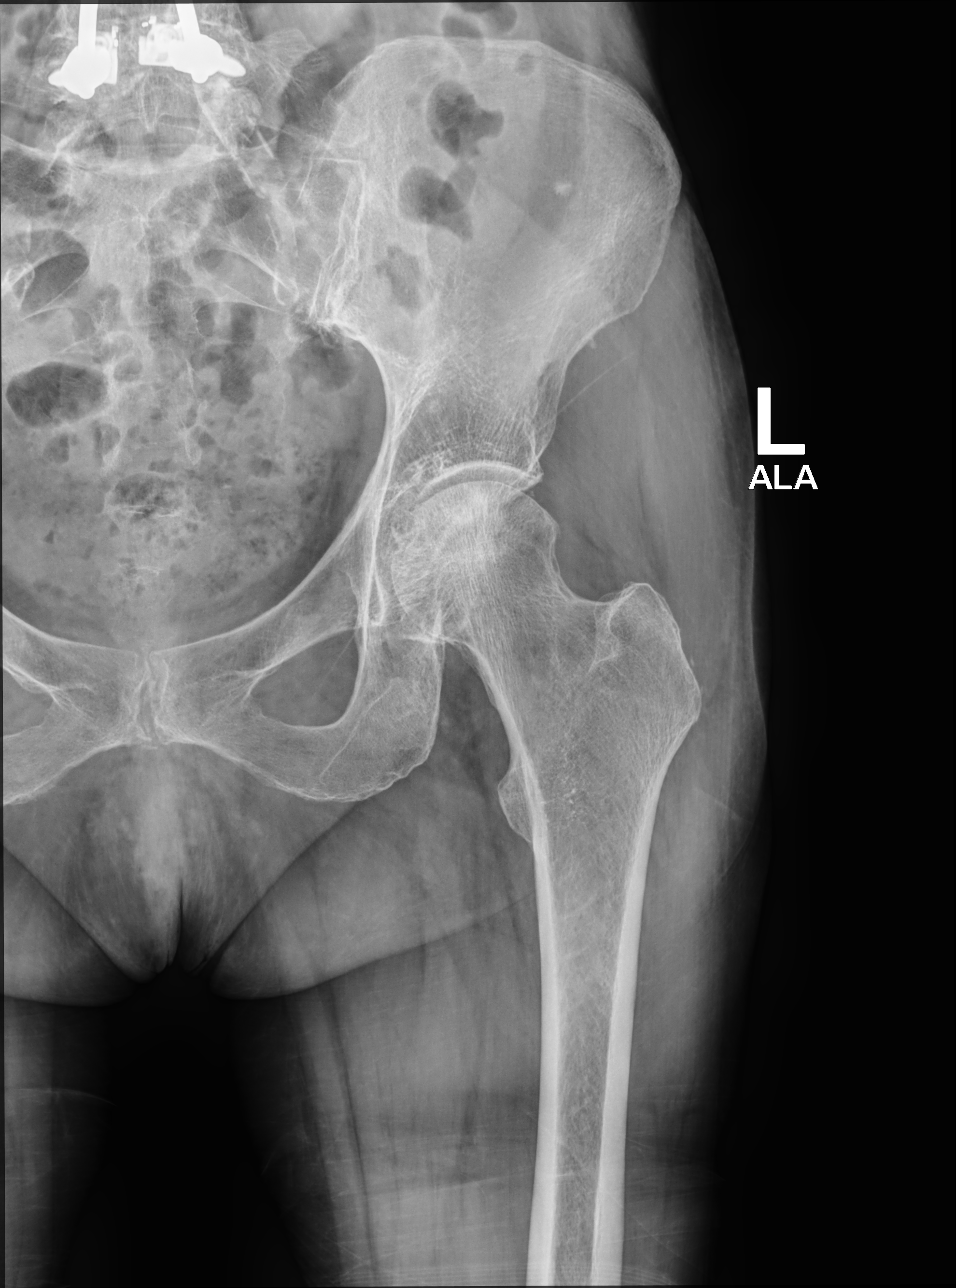

[hip (frog leg) lat]
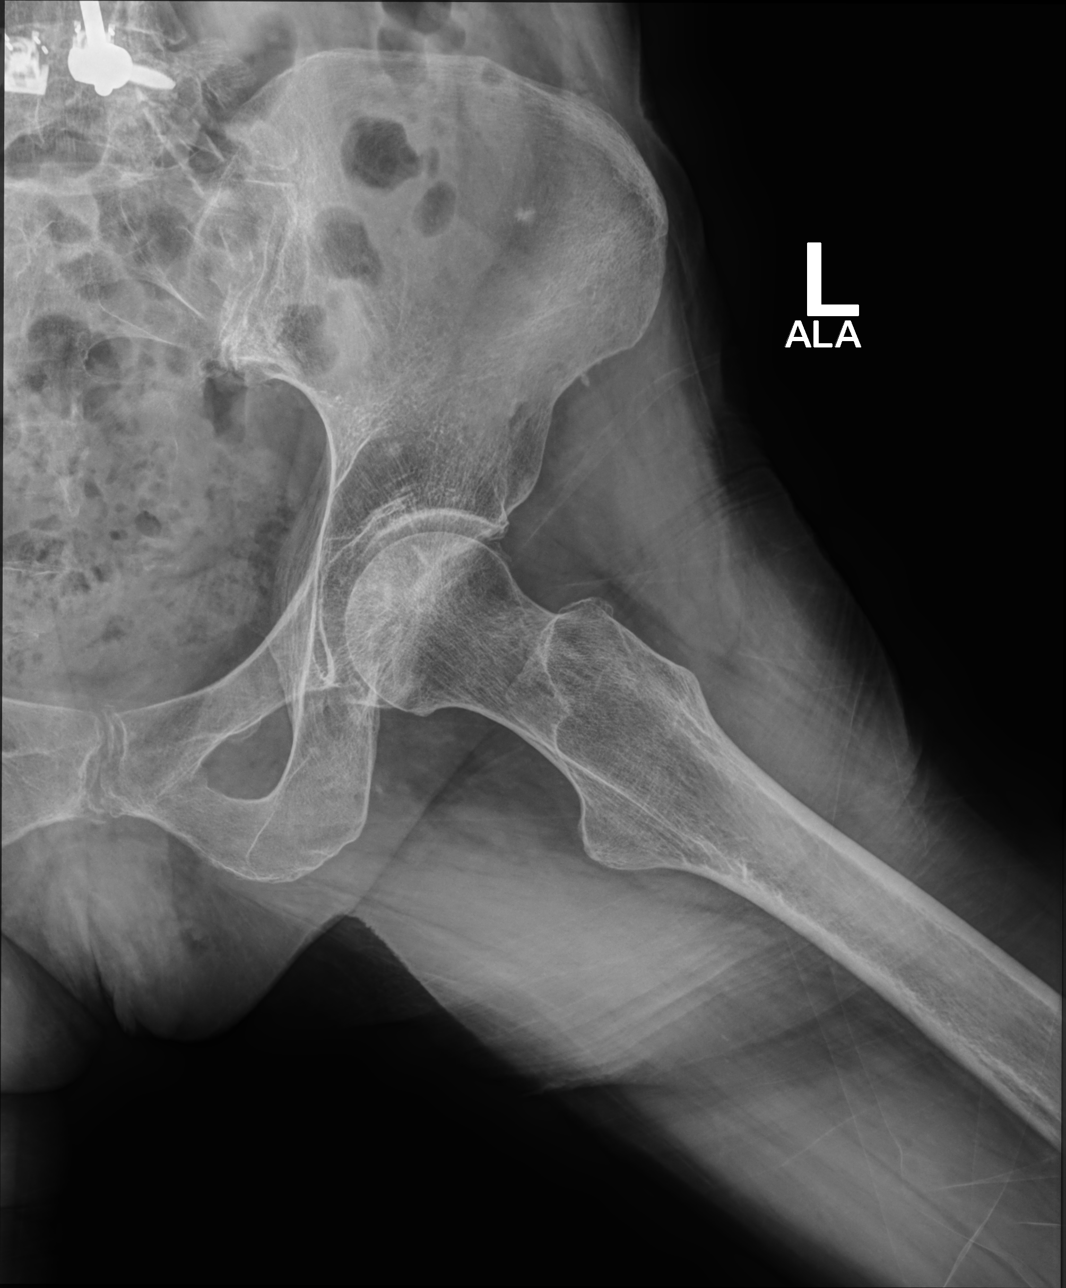

[3 of 3 positions shown; findings below may reference images not displayed]

FINDINGS: Frontal view of the pelvis as well as frontal and frogleg lateral
views of the left hip are obtained. No fracture, subluxation, or
dislocation. Mild symmetrical bilateral hip osteoarthritis. Soft
tissues are unremarkable. Postsurgical changes lower lumbar spine.
IMPRESSION: 1. Mild osteoarthritis.  No acute bony abnormality.

## 2023-10-31 ENCOUNTER — Telehealth: Payer: Self-pay | Admitting: Family Medicine

## 2023-10-31 NOTE — Telephone Encounter (Signed)
LVM for pt to reschedule provider out of office

## 2023-11-03 ENCOUNTER — Other Ambulatory Visit: Payer: Self-pay | Admitting: Family Medicine

## 2023-11-06 ENCOUNTER — Other Ambulatory Visit: Payer: Self-pay | Admitting: Family Medicine

## 2023-12-31 ENCOUNTER — Ambulatory Visit: Admitting: Family Medicine

## 2023-12-31 ENCOUNTER — Encounter: Payer: Self-pay | Admitting: Family Medicine

## 2023-12-31 ENCOUNTER — Ambulatory Visit: Payer: Self-pay | Admitting: Family Medicine

## 2023-12-31 VITALS — BP 138/70 | HR 96 | Temp 97.8°F | Wt 111.5 lb

## 2023-12-31 DIAGNOSIS — Z23 Encounter for immunization: Secondary | ICD-10-CM | POA: Diagnosis not present

## 2023-12-31 DIAGNOSIS — R7303 Prediabetes: Secondary | ICD-10-CM

## 2023-12-31 DIAGNOSIS — E785 Hyperlipidemia, unspecified: Secondary | ICD-10-CM

## 2023-12-31 DIAGNOSIS — E871 Hypo-osmolality and hyponatremia: Secondary | ICD-10-CM

## 2023-12-31 DIAGNOSIS — I10 Essential (primary) hypertension: Secondary | ICD-10-CM

## 2023-12-31 LAB — LIPID PANEL
Cholesterol: 227 mg/dL — ABNORMAL HIGH (ref 0–200)
HDL: 99.3 mg/dL (ref >39.00)
LDL Cholesterol: 110 mg/dL — ABNORMAL HIGH (ref 0–99)
NonHDL: 127.31
Total CHOL/HDL Ratio: 2
Triglycerides: 86 mg/dL (ref 0.0–149.0)
VLDL: 17.2 mg/dL (ref 0.0–40.0)

## 2023-12-31 LAB — COMPREHENSIVE METABOLIC PANEL WITH GFR
ALT: 14 U/L (ref 0–35)
AST: 21 U/L (ref 0–37)
Albumin: 4.6 g/dL (ref 3.5–5.2)
Alkaline Phosphatase: 69 U/L (ref 39–117)
BUN: 22 mg/dL (ref 6–23)
CO2: 25 meq/L (ref 19–32)
Calcium: 10.2 mg/dL (ref 8.4–10.5)
Chloride: 92 meq/L — ABNORMAL LOW (ref 96–112)
Creatinine, Ser: 1.17 mg/dL (ref 0.40–1.20)
GFR: 42.62 mL/min — ABNORMAL LOW
Glucose, Bld: 113 mg/dL — ABNORMAL HIGH (ref 70–99)
Potassium: 3.9 meq/L (ref 3.5–5.1)
Sodium: 130 meq/L — ABNORMAL LOW (ref 135–145)
Total Bilirubin: 0.9 mg/dL (ref 0.2–1.2)
Total Protein: 7.2 g/dL (ref 6.0–8.3)

## 2023-12-31 LAB — HEMOGLOBIN A1C: Hgb A1c MFr Bld: 6.2 % (ref 4.6–6.5)

## 2023-12-31 MED ORDER — LOSARTAN POTASSIUM-HCTZ 100-12.5 MG PO TABS: 1.0000 | ORAL_TABLET | Freq: Every day | ORAL | 3 refills | Status: AC

## 2023-12-31 MED ORDER — ATORVASTATIN CALCIUM 20 MG PO TABS: 20.0000 mg | ORAL_TABLET | Freq: Every day | ORAL | 3 refills | Status: AC

## 2023-12-31 MED ORDER — AMLODIPINE BESYLATE 5 MG PO TABS: 5.0000 mg | ORAL_TABLET | Freq: Every day | ORAL | 3 refills | Status: AC

## 2023-12-31 NOTE — Progress Notes (Signed)
 Established Patient Office Visit  Subjective   Patient ID: ICELA GLYMPH, female    DOB: May 01, 1940  Age: 83 y.o. MRN: [id number]  Chief Complaint  Patient presents with   Medical Management of Chronic Issues    HPI   Ms. Wool is seen today for routine ongoing medical follow-up.  She has hypertension, chronic kidney disease with GFR around 36, dementia, hyperlipidemia, prediabetes.  She has not had labs in over a year.  She has very supportive husband.  They usually walk a couple times per day.  No recent reported falls.  No recent behavioral disturbance.  Her usual medications include amlodipine , losartan /HCTZ, and atorvastatin .  She does need flu vaccine today.  They are careful to avoid nonsteroidals.  She infrequently takes Tylenol .  They have decided against any further health maintenance items such as mammography  They bring in a log of home blood pressures and these range from 109 systolic to 140 but most of her systolics are very well-controlled.  Diastolics consistently 50s to 70 range  Past Medical History:  Diagnosis Date   HYPERLIPIDEMIA 07/18/2008   HYPERTENSION 07/18/2008   OSTEOPOROSIS 07/25/2008   Past Surgical History:  Procedure Laterality Date   CARPAL TUNNEL RELEASE  05/2018   right hand   LUMBAR DISC SURGERY  1998    reports that she has never smoked. She has never used smokeless tobacco. She reports current alcohol use. She reports that she does not use drugs. family history includes Aneurysm in her father; Cancer in her paternal aunt and paternal grandmother; Hyperlipidemia in her mother and sister; Hypertension in her mother and sister; Stroke (age of onset: 17) in her father. No Known Allergies  Review of Systems  Constitutional:  Negative for malaise/fatigue.  Eyes:  Negative for blurred vision.  Respiratory:  Negative for shortness of breath.   Cardiovascular:  Negative for chest pain and leg swelling.  Neurological:  Negative for dizziness, weakness and  headaches.      Objective:     BP 138/70 (BP Location: Left Arm, Cuff Size: Normal)   Pulse 96   Temp 97.8 F (36.6 C) (Oral)   Wt 111 lb 8 oz (50.6 kg)   SpO2 97%   BMI 18.55 kg/m  BP Readings from Last 3 Encounters:  12/31/23 138/70  07/01/23 (!) 142/72  11/04/22 130/68   Wt Readings from Last 3 Encounters:  12/31/23 111 lb 8 oz (50.6 kg)  07/01/23 112 lb 1.6 oz (50.8 kg)  11/04/22 111 lb 14.4 oz (50.8 kg)      Physical Exam Vitals reviewed.  Constitutional:      General: She is not in acute distress.    Appearance: She is well-developed. She is not ill-appearing.  Eyes:     Pupils: Pupils are equal, round, and reactive to light.  Neck:     Thyroid : No thyromegaly.     Vascular: No JVD.  Cardiovascular:     Rate and Rhythm: Normal rate and regular rhythm.     Heart sounds:     No gallop.  Pulmonary:     Effort: Pulmonary effort is normal. No respiratory distress.     Breath sounds: Normal breath sounds. No wheezing or rales.  Musculoskeletal:     Cervical back: Neck supple.     Right lower leg: No edema.     Left lower leg: No edema.  Neurological:     Mental Status: She is alert.      No results found  for any visits on 12/31/23.  Last CBC Lab Results  Component Value Date   WBC 5.4 11/01/2020   HGB 13.1 11/01/2020   HCT 38.1 11/01/2020   MCV 92.9 11/01/2020   MCH 32.1 04/09/2020   RDW 13.5 11/01/2020   PLT 258.0 11/01/2020   Last metabolic panel Lab Results  Component Value Date   GLUCOSE 123 (H) 11/04/2022   NA 136 11/04/2022   K 4.5 11/04/2022   CL 96 11/04/2022   CO2 28 11/04/2022   BUN 21 11/04/2022   CREATININE 1.34 (H) 11/04/2022   GFR 36.51 (L) 11/04/2022   CALCIUM  10.3 11/04/2022   PROT 7.3 11/04/2022   ALBUMIN 4.6 11/04/2022   BILITOT 0.9 11/04/2022   ALKPHOS 79 11/04/2022   AST 19 11/04/2022   ALT 12 11/04/2022   ANIONGAP 13 04/09/2020   Last lipids Lab Results  Component Value Date   CHOL 229 (H) 11/04/2022   HDL  94.20 11/04/2022   LDLCALC 113 (H) 11/04/2022   LDLDIRECT 116.6 06/14/2013   TRIG 108.0 11/04/2022   CHOLHDL 2 11/04/2022   Last hemoglobin A1c Lab Results  Component Value Date   HGBA1C 5.9 11/04/2022      The ASCVD Risk score (Arnett DK, et al., 2019) failed to calculate for the following reasons:   The 2019 ASCVD risk score is only valid for ages 90 to 22    Assessment & Plan:   Problem List Items Addressed This Visit       Unprioritized   Prediabetes   Relevant Orders   Hemoglobin A1c   Hyperlipidemia   Relevant Medications   losartan -hydrochlorothiazide  (HYZAAR) 100-12.5 MG tablet   atorvastatin  (LIPITOR) 20 MG tablet   amLODipine  (NORVASC ) 5 MG tablet   Other Relevant Orders   Lipid panel   CMP   Essential hypertension - Primary   Relevant Medications   losartan -hydrochlorothiazide  (HYZAAR) 100-12.5 MG tablet   atorvastatin  (LIPITOR) 20 MG tablet   amLODipine  (NORVASC ) 5 MG tablet   Other Relevant Orders   CMP  Chronic medical problems as above.  Blood pressure stable for the most part by home readings.  Continue current regimen.  Follow-up electrolytes today with comprehensive metabolic panel  Overdue for follow-up lipids and will check lipid and CMP  Recheck A1c with prior prediabetes range blood sugars.  We did discuss that 83 not being overly restrictive of reducing carbs but trying to avoid very high glycemic index items such as juices on a regular basis  Return in about 6 months (around 06/29/2024).    Wolm Scarlet, MD

## 2023-12-31 NOTE — Addendum Note (Signed)
 Addended by: METTA KRISTEN CROME on: 12/31/2023 09:54 AM   Modules accepted: Orders

## 2024-01-01 ENCOUNTER — Encounter: Payer: Self-pay | Admitting: Family Medicine

## 2024-01-02 ENCOUNTER — Ambulatory Visit: Admitting: Family Medicine

## 2024-02-02 ENCOUNTER — Other Ambulatory Visit

## 2024-02-02 ENCOUNTER — Ambulatory Visit: Payer: Self-pay | Admitting: Family Medicine

## 2024-02-02 DIAGNOSIS — E871 Hypo-osmolality and hyponatremia: Secondary | ICD-10-CM

## 2024-02-02 LAB — BASIC METABOLIC PANEL WITH GFR
BUN: 21 mg/dL (ref 6–23)
CO2: 29 meq/L (ref 19–32)
Calcium: 9.6 mg/dL (ref 8.4–10.5)
Chloride: 94 meq/L — ABNORMAL LOW (ref 96–112)
Creatinine, Ser: 1.2 mg/dL (ref 0.40–1.20)
GFR: 41.32 mL/min — ABNORMAL LOW (ref >60.00)
Glucose, Bld: 128 mg/dL — ABNORMAL HIGH (ref 70–99)
Potassium: 4.3 meq/L (ref 3.5–5.1)
Sodium: 132 meq/L — ABNORMAL LOW (ref 135–145)

## 2024-06-30 ENCOUNTER — Ambulatory Visit: Admitting: Family Medicine
